# Patient Record
Sex: Female | Born: 1980
Health system: Southern US, Community
[De-identification: ages and names within clinical notes are randomized; demographics above are authoritative.]

## PROBLEM LIST (undated history)

## (undated) DIAGNOSIS — E039 Hypothyroidism, unspecified: Secondary | ICD-10-CM

## (undated) DIAGNOSIS — K219 Gastro-esophageal reflux disease without esophagitis: Secondary | ICD-10-CM

## (undated) DIAGNOSIS — D62 Acute posthemorrhagic anemia: Secondary | ICD-10-CM

## (undated) HISTORY — DX: Gastro-esophageal reflux disease without esophagitis: K21.9

## (undated) HISTORY — DX: Hypothyroidism, unspecified: E03.9

## (undated) HISTORY — PX: TOTAL THYROIDECTOMY: SHX2547

## (undated) HISTORY — PX: PARATHYROIDECTOMY: SHX19

---

## 2014-01-29 ENCOUNTER — Ambulatory Visit: Payer: Self-pay | Admitting: General Practice

## 2014-04-05 ENCOUNTER — Ambulatory Visit: Payer: Self-pay | Admitting: General Practice

## 2015-10-15 DIAGNOSIS — Z9889 Other specified postprocedural states: Secondary | ICD-10-CM | POA: Insufficient documentation

## 2015-10-15 DIAGNOSIS — E890 Postprocedural hypothyroidism: Secondary | ICD-10-CM | POA: Insufficient documentation

## 2016-01-16 IMAGING — CR DG FOOT COMPLETE 3+V*L*
1 series · 3 of 3 positions shown · non-contrast
Comparison: None.

CLINICAL DATA: Medial foot pain.

EXAM:
LEFT FOOT - COMPLETE 3+ VIEW

[Series 1: dxr foot lt comp w/obliques · 0.14mm/px · 3 of 3 slices shown]
[im 1/3]
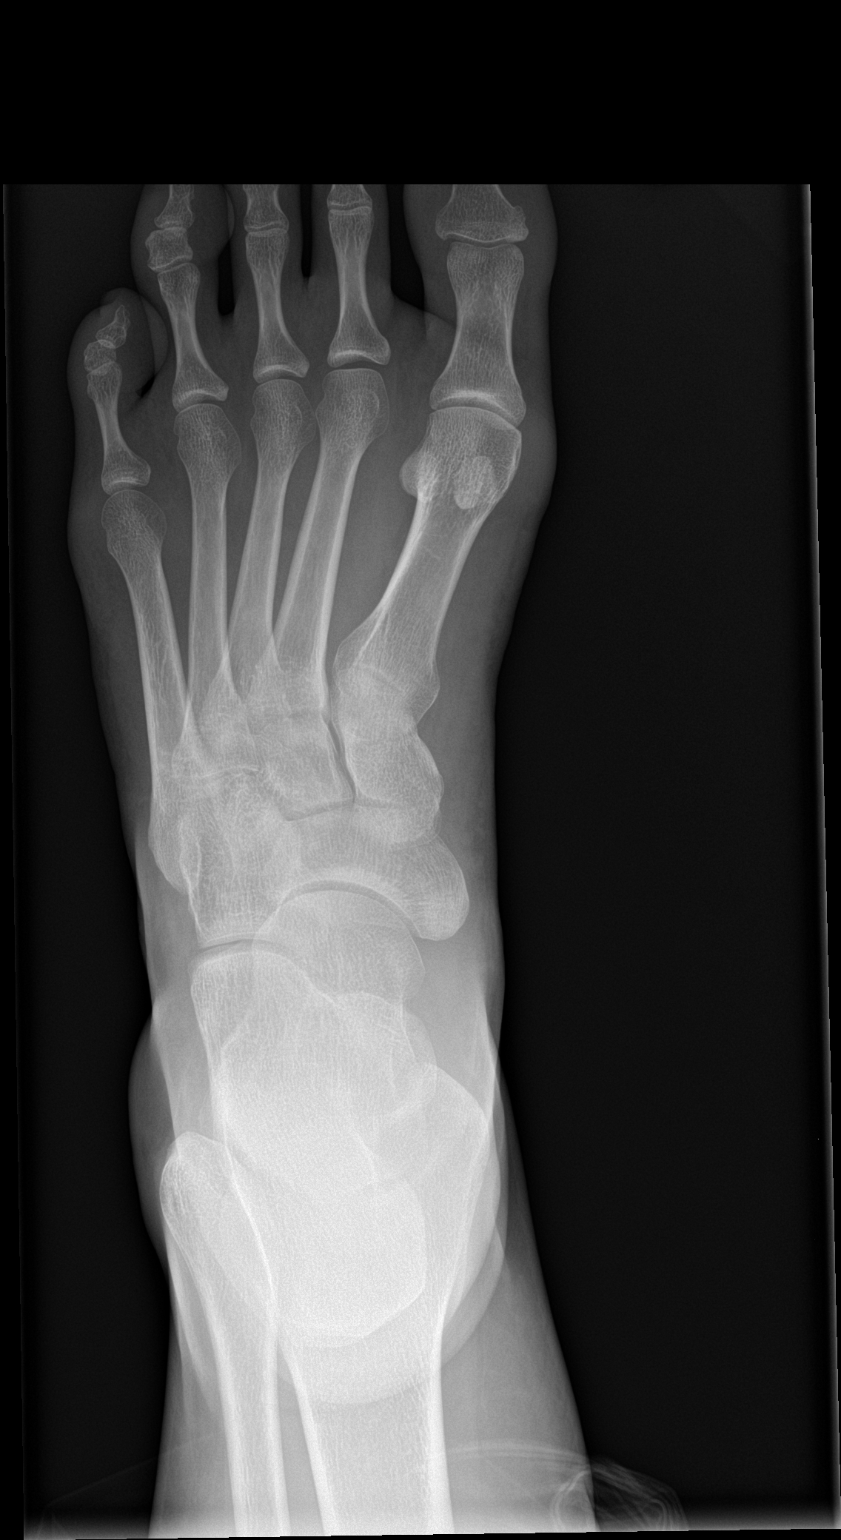
[im 2/3]
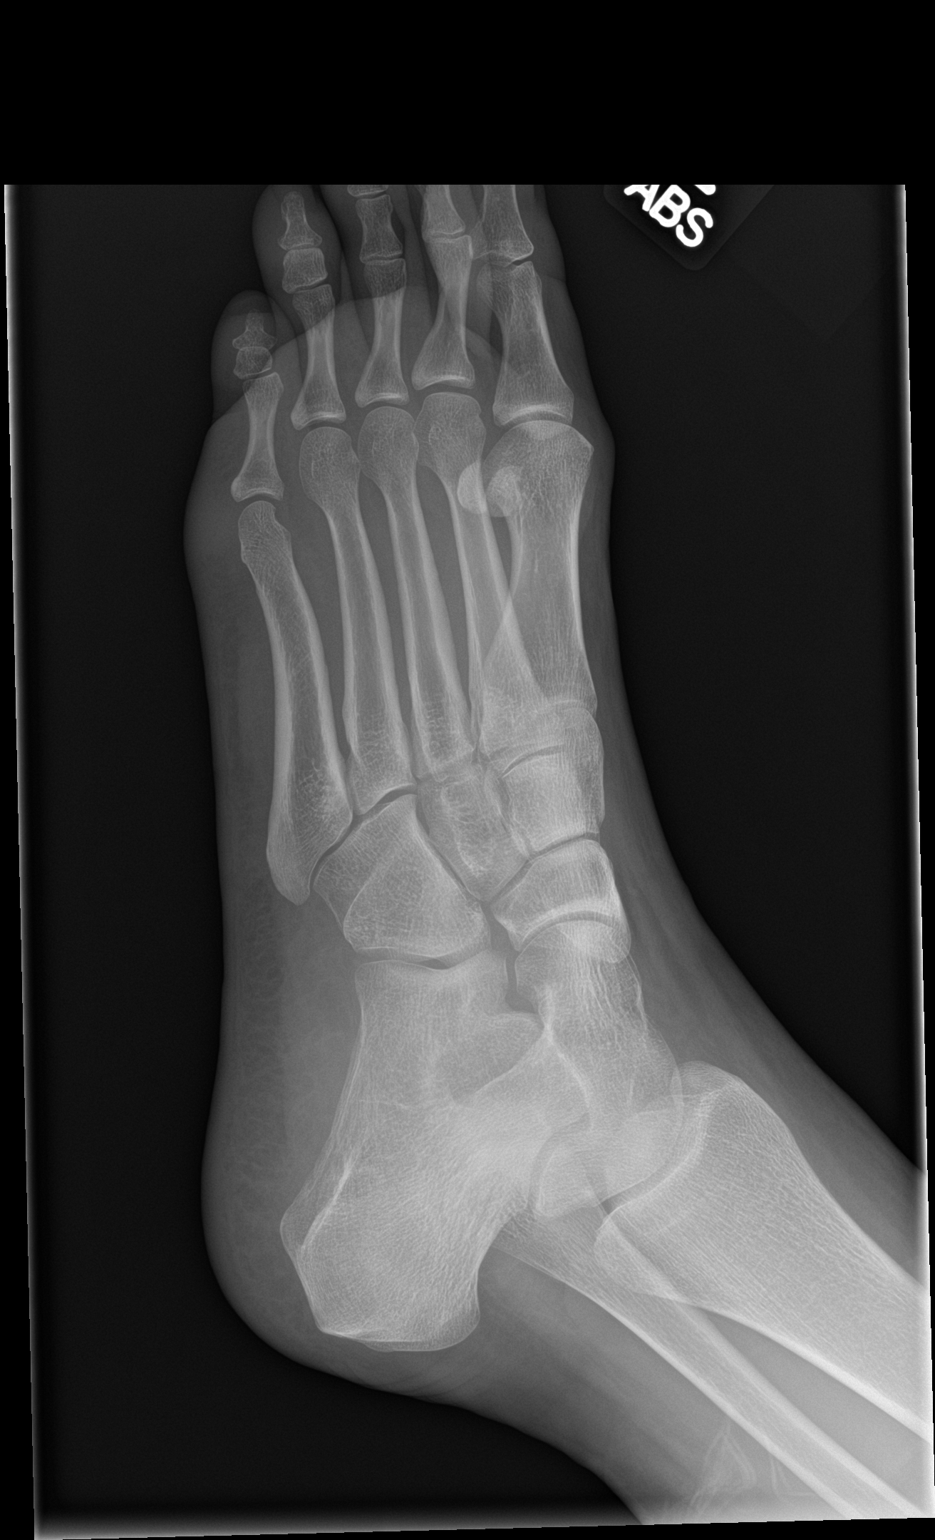
[im 3/3]
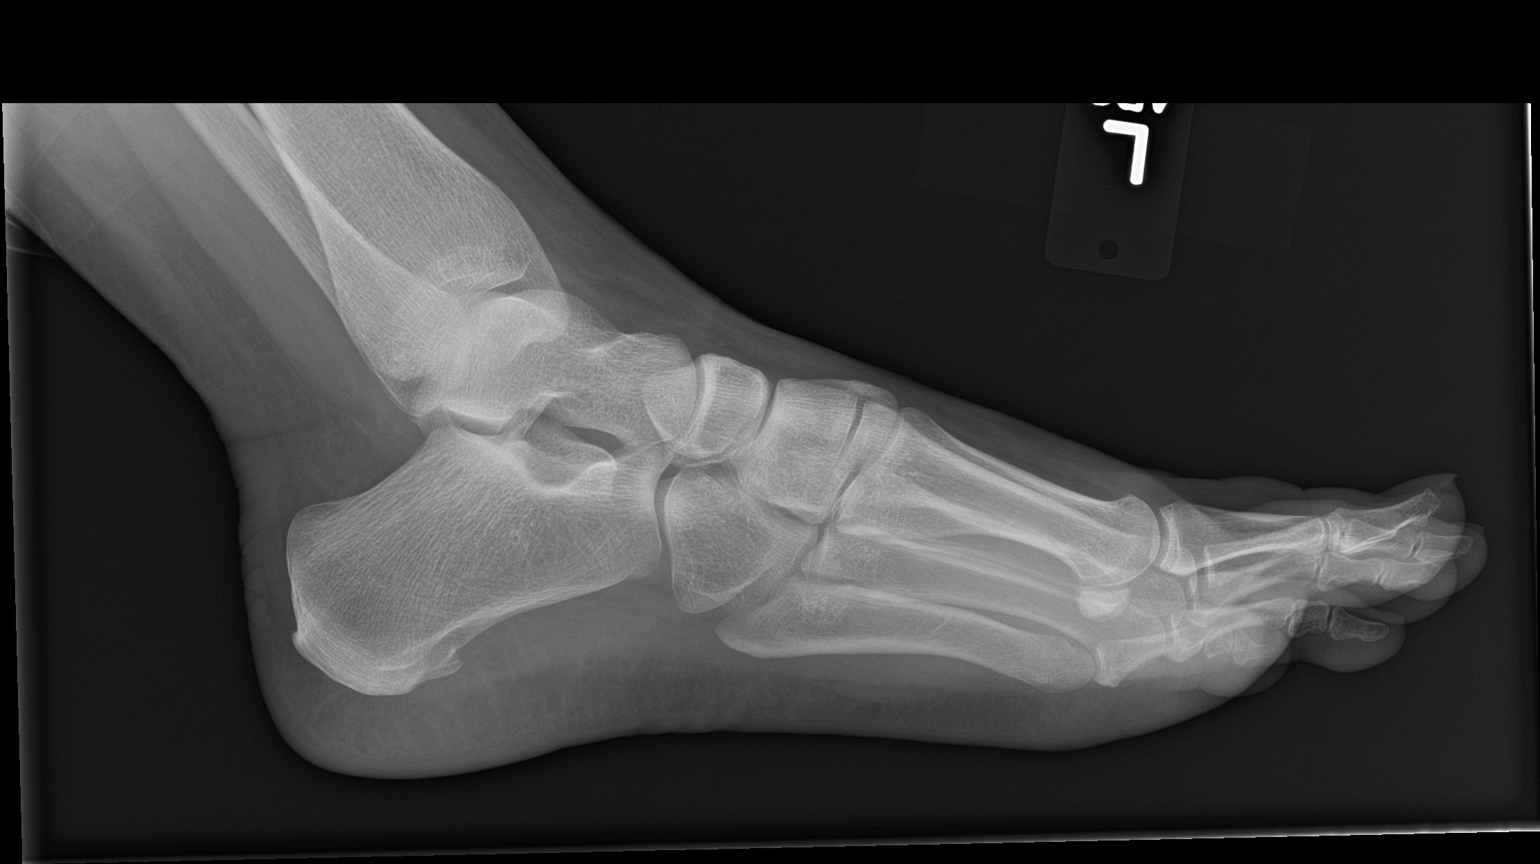

[3 of 3 positions shown; findings below may reference images not displayed]

FINDINGS: No fracture. No bone lesion. Joints are normally spaced and aligned.

Small plantar calcaneal spur.  Soft tissues are unremarkable.
IMPRESSION: Small plantar calcaneal spur.  No other abnormality.

## 2016-01-17 LAB — OB RESULTS CONSOLE GC/CHLAMYDIA
Chlamydia: NEGATIVE
Gonorrhea: NEGATIVE

## 2016-01-31 LAB — OB RESULTS CONSOLE RPR: RPR: NONREACTIVE

## 2016-01-31 LAB — OB RESULTS CONSOLE HIV ANTIBODY (ROUTINE TESTING): HIV: NONREACTIVE

## 2016-01-31 LAB — OB RESULTS CONSOLE ABO/RH: RH TYPE: POSITIVE

## 2016-01-31 LAB — OB RESULTS CONSOLE HEPATITIS B SURFACE ANTIGEN: HEP B S AG: NEGATIVE

## 2016-01-31 LAB — OB RESULTS CONSOLE RUBELLA ANTIBODY, IGM: Rubella: IMMUNE

## 2016-01-31 LAB — OB RESULTS CONSOLE ANTIBODY SCREEN: Antibody Screen: NEGATIVE

## 2016-03-22 IMAGING — US THYROID ULTRASOUND
1 series · 13 of 25 positions shown · non-contrast
Comparison: None.

CLINICAL DATA: Fatigue, swelling below the thyroid. Evaluate
thyroid nodule. Initial encounter.

EXAM:
THYROID ULTRASOUND
TECHNIQUE: Ultrasound examination of the thyroid gland and adjacent soft
tissues was performed.

[Series 1: thyroid ultrasound · 0.08mm/px · 13 of 68 slices shown]
[im 1/68]
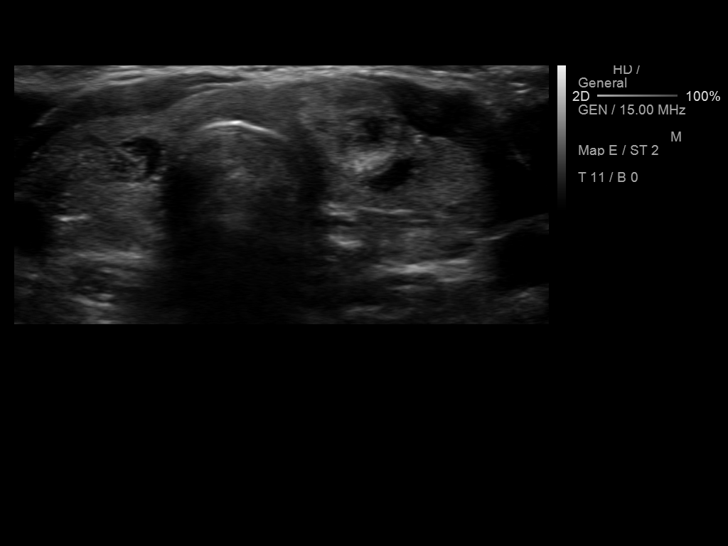
[im 6/68]
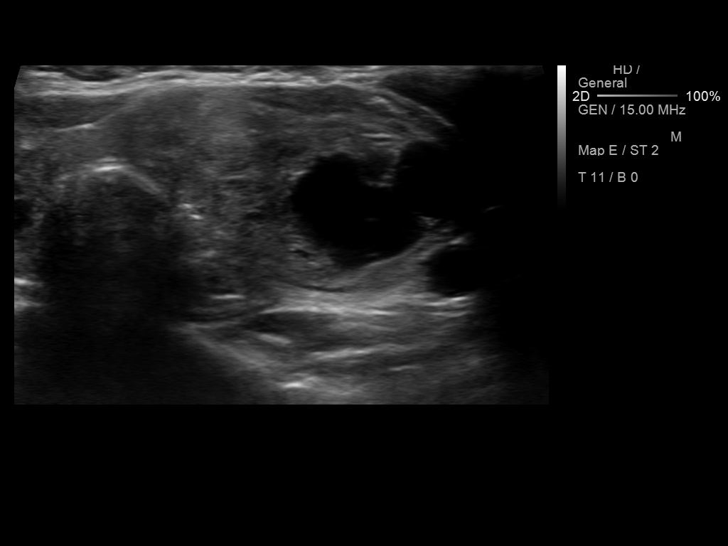
[im 12/68]
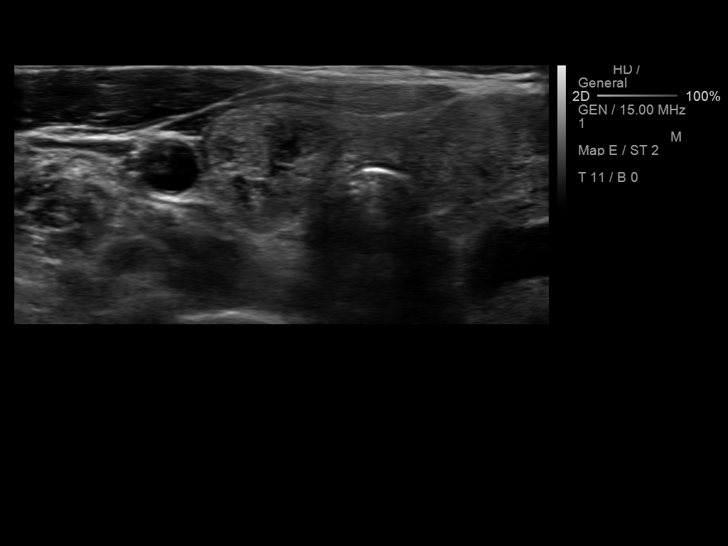
[im 17/68]
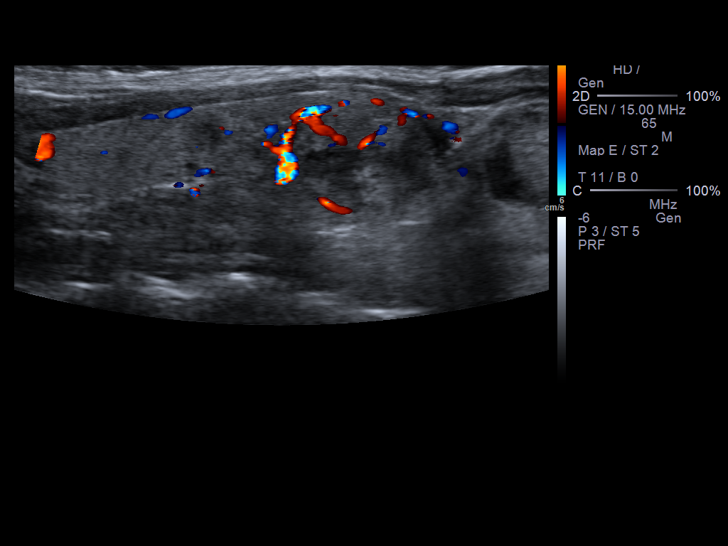
[im 23/68]
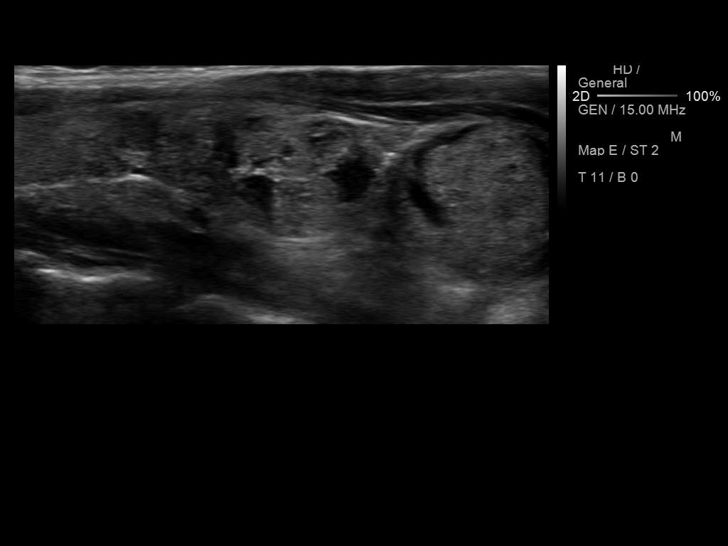
[im 28/68]
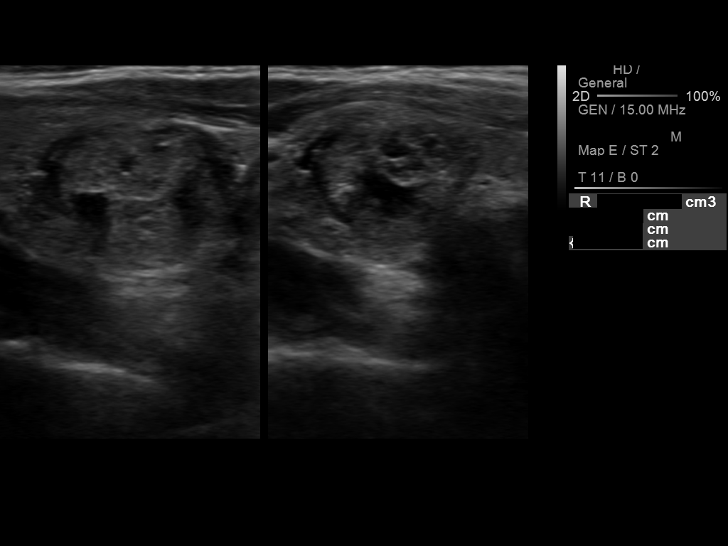
[im 34/68]
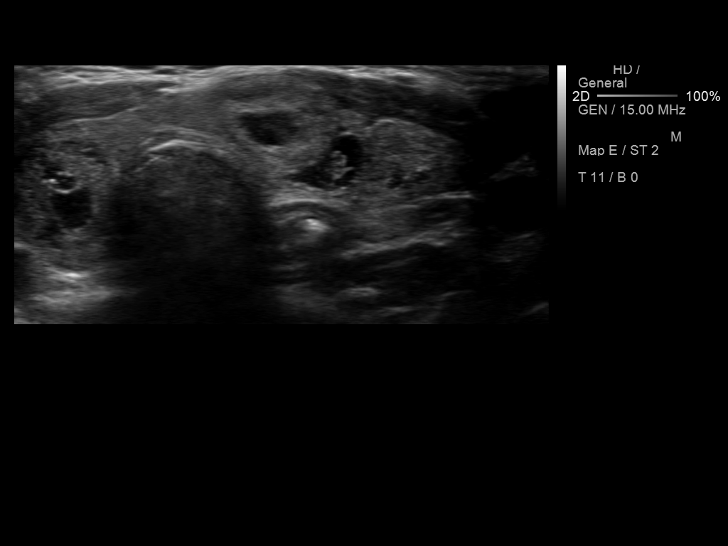
[im 40/68]
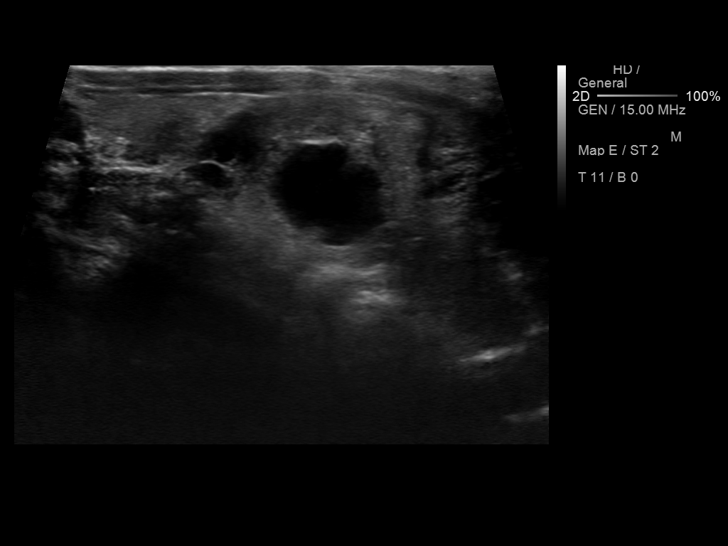
[im 45/68]
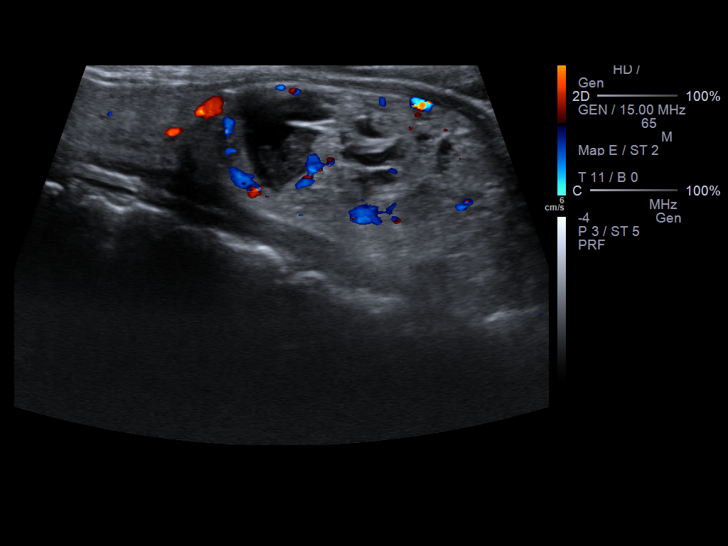
[im 51/68]
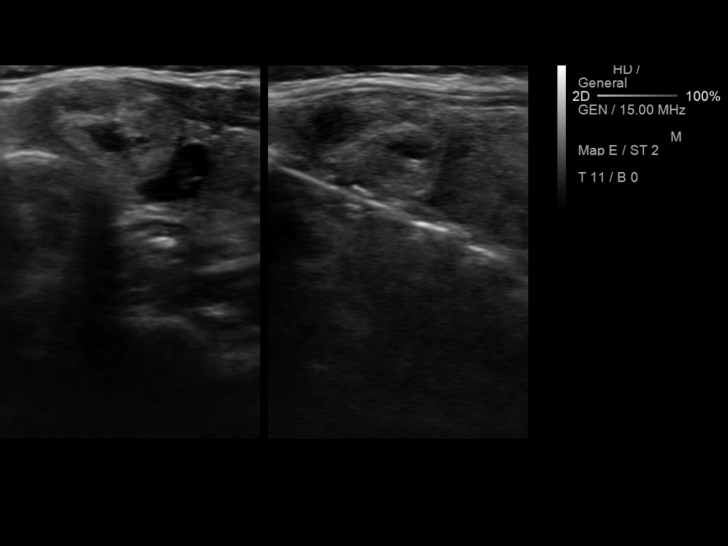
[im 56/68]
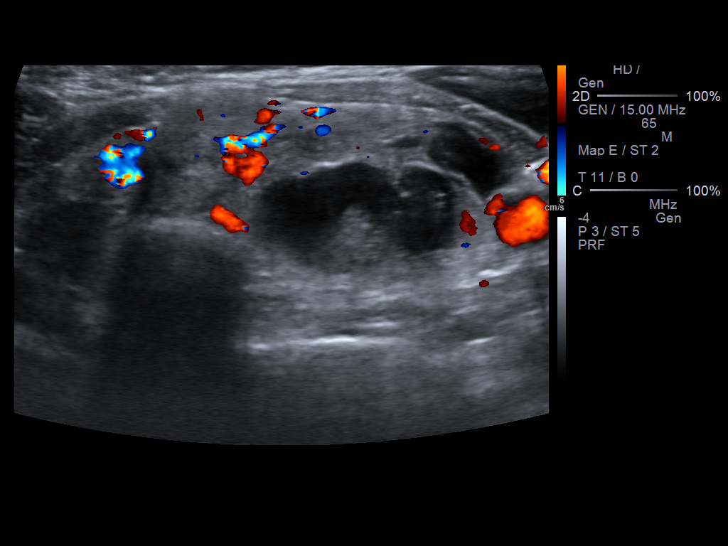
[im 62/68]
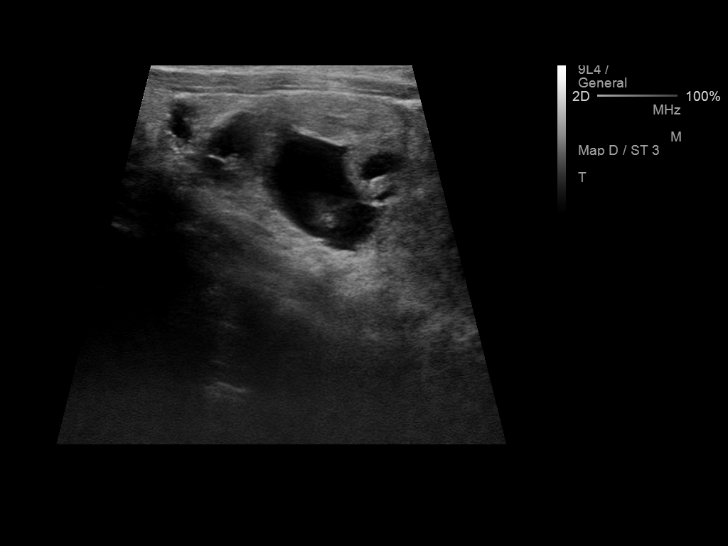
[im 68/68]
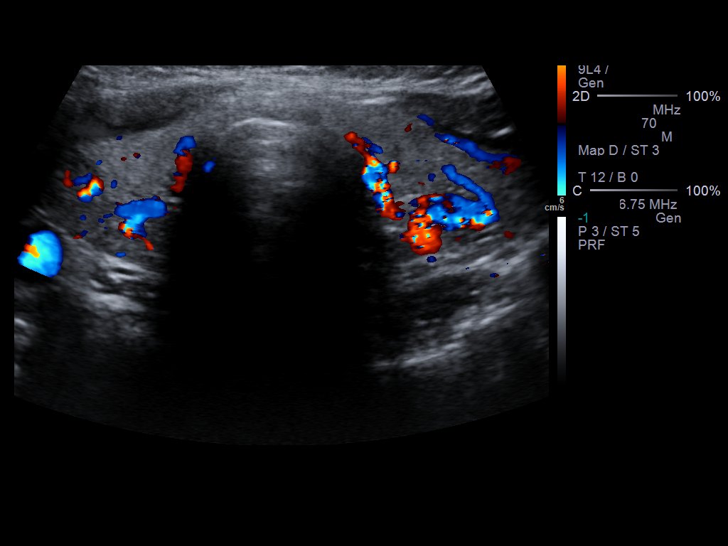

[13 of 25 positions shown; findings below may reference images not displayed]

FINDINGS: Right thyroid lobe

Measurements: Normal in size measuring 4.9 x 1.2 x 1.6 cm.

Right, mid - 0.6 x 0.5 x 0.6 cm - mixed echogenic, solid.

*Right, inferior - 1.8 x 1.3 x 1.5 cm - mixed echogenic, partially
cystic, predominantly solid.

Left thyroid lobe

Measurements: Enlarged measuring 7.3 x 2.3 x 3.4 cm.

Left, mid, medial - 1.4 x 0.9 x 1.4 cm - mixed echogenic, partially
cystic, predominantly solid.

*Left, mid/inferior - 6.0 x 2.3 x 5.7 cm - mixed echogenic,
partially cystic, predominantly solid

Isthmus

Thickness: Normal in size measures 0.4 cm in diameter..

No discrete nodules are identified within the thyroid isthmus.

Lymphadenopathy

None visualized.
IMPRESSION: Findings compatible with multi nodular goiter. The dominant
approximately 1.8 cm complex nodule within the inferior aspect of
the right lobe of the thyroid as well as the dominant approximately
6.0 cm complex mass within the mid / inferior aspect of the left
lobe of the thyroid both meet imaging criteria to recommend
percutaneous sampling as clinically indicated. This recommendation
follows the consensus statement: Management of Thyroid Nodules
Detected at US: Society of Radiologists in Ultrasound Consensus

## 2016-05-21 LAB — OB RESULTS CONSOLE GBS: GBS: NEGATIVE

## 2016-06-17 ENCOUNTER — Encounter (HOSPITAL_COMMUNITY): Payer: Self-pay | Admitting: *Deleted

## 2016-06-17 ENCOUNTER — Telehealth (HOSPITAL_COMMUNITY): Payer: Self-pay | Admitting: *Deleted

## 2016-06-17 NOTE — Telephone Encounter (Signed)
Preadmission screen  

## 2016-06-25 ENCOUNTER — Other Ambulatory Visit: Payer: Self-pay | Admitting: Obstetrics

## 2016-06-25 ENCOUNTER — Inpatient Hospital Stay (HOSPITAL_COMMUNITY): Admission: AD | Admit: 2016-06-25 | Payer: Self-pay | Source: Ambulatory Visit | Admitting: Obstetrics

## 2016-07-01 ENCOUNTER — Inpatient Hospital Stay (HOSPITAL_COMMUNITY)
Admission: RE | Admit: 2016-07-01 | Discharge: 2016-07-04 | DRG: 765 | Disposition: A | Discharge status: Home / Self Care | Payer: BLUE CROSS/BLUE SHIELD | Source: Ambulatory Visit | Attending: Obstetrics and Gynecology | Admitting: Obstetrics and Gynecology

## 2016-07-01 ENCOUNTER — Inpatient Hospital Stay (HOSPITAL_COMMUNITY): Payer: BLUE CROSS/BLUE SHIELD | Admitting: Anesthesiology

## 2016-07-01 ENCOUNTER — Encounter (HOSPITAL_COMMUNITY): Payer: Self-pay

## 2016-07-01 DIAGNOSIS — Z3A41 41 weeks gestation of pregnancy: Secondary | ICD-10-CM

## 2016-07-01 DIAGNOSIS — O4202 Full-term premature rupture of membranes, onset of labor within 24 hours of rupture: Secondary | ICD-10-CM | POA: Diagnosis present

## 2016-07-01 DIAGNOSIS — O411230 Chorioamnionitis, third trimester, not applicable or unspecified: Secondary | ICD-10-CM | POA: Diagnosis present

## 2016-07-01 DIAGNOSIS — O480 Post-term pregnancy: Principal | ICD-10-CM | POA: Diagnosis present

## 2016-07-01 DIAGNOSIS — D509 Iron deficiency anemia, unspecified: Secondary | ICD-10-CM | POA: Diagnosis present

## 2016-07-01 DIAGNOSIS — O9081 Anemia of the puerperium: Secondary | ICD-10-CM | POA: Diagnosis not present

## 2016-07-01 DIAGNOSIS — O99284 Endocrine, nutritional and metabolic diseases complicating childbirth: Secondary | ICD-10-CM | POA: Diagnosis present

## 2016-07-01 DIAGNOSIS — D62 Acute posthemorrhagic anemia: Secondary | ICD-10-CM | POA: Diagnosis not present

## 2016-07-01 DIAGNOSIS — Z3490 Encounter for supervision of normal pregnancy, unspecified, unspecified trimester: Secondary | ICD-10-CM

## 2016-07-01 DIAGNOSIS — Z8249 Family history of ischemic heart disease and other diseases of the circulatory system: Secondary | ICD-10-CM

## 2016-07-01 DIAGNOSIS — E039 Hypothyroidism, unspecified: Secondary | ICD-10-CM | POA: Diagnosis present

## 2016-07-01 HISTORY — DX: Acute posthemorrhagic anemia: D62

## 2016-07-01 LAB — CBC
HCT: 33.5 % — ABNORMAL LOW (ref 36.0–46.0)
Hemoglobin: 11.6 g/dL — ABNORMAL LOW (ref 12.0–15.0)
MCH: 30.3 pg (ref 26.0–34.0)
MCHC: 34.6 g/dL (ref 30.0–36.0)
MCV: 87.5 fL (ref 78.0–100.0)
PLATELETS: 196 10*3/uL (ref 150–400)
RBC: 3.83 MIL/uL — ABNORMAL LOW (ref 3.87–5.11)
RDW: 13.4 % (ref 11.5–15.5)
WBC: 15.8 10*3/uL — AB (ref 4.0–10.5)

## 2016-07-01 LAB — TYPE AND SCREEN
ABO/RH(D): A POS
ANTIBODY SCREEN: NEGATIVE

## 2016-07-01 LAB — ABO/RH: ABO/RH(D): A POS

## 2016-07-01 LAB — RPR: RPR Ser Ql: NONREACTIVE

## 2016-07-01 MED ORDER — LACTATED RINGERS IV SOLN
INTRAVENOUS | Status: DC
  Administered 2016-07-01 – 2016-07-02 (×6): via INTRAVENOUS

## 2016-07-01 MED ORDER — DIPHENHYDRAMINE HCL 50 MG/ML IJ SOLN: 12.5000 mg | INTRAMUSCULAR | Status: DC | PRN

## 2016-07-01 MED ORDER — LACTATED RINGERS IV SOLN
500.0000 mL | INTRAVENOUS | Status: DC | PRN
  Administered 2016-07-01: 1000 mL via INTRAVENOUS

## 2016-07-01 MED ORDER — TERBUTALINE SULFATE 1 MG/ML IJ SOLN: 0.2500 mg | Freq: Once | INTRAMUSCULAR | Status: DC | PRN

## 2016-07-01 MED ORDER — ONDANSETRON HCL 4 MG/2ML IJ SOLN
4.0000 mg | Freq: Four times a day (QID) | INTRAMUSCULAR | Status: DC | PRN
  Administered 2016-07-01: 4 mg via INTRAVENOUS
  Filled 2016-07-01: qty 2

## 2016-07-01 MED ORDER — PHENYLEPHRINE 40 MCG/ML (10ML) SYRINGE FOR IV PUSH (FOR BLOOD PRESSURE SUPPORT)
PREFILLED_SYRINGE | INTRAVENOUS | Status: AC
  Filled 2016-07-01: qty 10

## 2016-07-01 MED ORDER — OXYTOCIN 40 UNITS IN LACTATED RINGERS INFUSION - SIMPLE MED
1.0000 m[IU]/min | INTRAVENOUS | Status: DC
  Administered 2016-07-01: 2 m[IU]/min via INTRAVENOUS
  Filled 2016-07-01: qty 1000

## 2016-07-01 MED ORDER — FLEET ENEMA 7-19 GM/118ML RE ENEM: 1.0000 | ENEMA | Freq: Once | RECTAL | Status: DC

## 2016-07-01 MED ORDER — LEVOTHYROXINE SODIUM 137 MCG PO TABS
212.0000 ug | ORAL_TABLET | Freq: Every day | ORAL | Status: DC
  Administered 2016-07-01: 212 ug via ORAL
  Filled 2016-07-01 (×2): qty 1

## 2016-07-01 MED ORDER — OXYCODONE-ACETAMINOPHEN 5-325 MG PO TABS: 1.0000 | ORAL_TABLET | ORAL | Status: DC | PRN

## 2016-07-01 MED ORDER — PHENYLEPHRINE 40 MCG/ML (10ML) SYRINGE FOR IV PUSH (FOR BLOOD PRESSURE SUPPORT): 80.0000 ug | PREFILLED_SYRINGE | INTRAVENOUS | Status: DC | PRN

## 2016-07-01 MED ORDER — LACTATED RINGERS IV SOLN: 500.0000 mL | Freq: Once | INTRAVENOUS | Status: DC

## 2016-07-01 MED ORDER — LIDOCAINE HCL (PF) 1 % IJ SOLN: 30.0000 mL | INTRAMUSCULAR | Status: DC | PRN

## 2016-07-01 MED ORDER — BUTORPHANOL TARTRATE 1 MG/ML IJ SOLN
1.0000 mg | INTRAMUSCULAR | Status: DC | PRN
  Administered 2016-07-01: 1 mg via INTRAVENOUS

## 2016-07-01 MED ORDER — EPHEDRINE 5 MG/ML INJ: 10.0000 mg | INTRAVENOUS | Status: DC | PRN

## 2016-07-01 MED ORDER — MISOPROSTOL 25 MCG QUARTER TABLET
25.0000 ug | ORAL_TABLET | ORAL | Status: AC | PRN
  Administered 2016-07-01 (×2): 25 ug via VAGINAL
  Filled 2016-07-01 (×2): qty 0.25

## 2016-07-01 MED ORDER — SOD CITRATE-CITRIC ACID 500-334 MG/5ML PO SOLN: 30.0000 mL | ORAL | Status: DC | PRN

## 2016-07-01 MED ORDER — OXYCODONE-ACETAMINOPHEN 5-325 MG PO TABS: 2.0000 | ORAL_TABLET | ORAL | Status: DC | PRN

## 2016-07-01 MED ORDER — LIDOCAINE HCL (PF) 1 % IJ SOLN
INTRAMUSCULAR | Status: DC | PRN
  Administered 2016-07-01: 6 mL via EPIDURAL
  Administered 2016-07-01: 4 mL

## 2016-07-01 MED ORDER — ACETAMINOPHEN 325 MG PO TABS
650.0000 mg | ORAL_TABLET | ORAL | Status: DC | PRN
  Administered 2016-07-02: 650 mg via ORAL
  Administered 2016-07-02: 325 mg via ORAL
  Filled 2016-07-01 (×2): qty 2

## 2016-07-01 MED ORDER — BUTORPHANOL TARTRATE 1 MG/ML IJ SOLN
INTRAMUSCULAR | Status: AC
  Filled 2016-07-01: qty 1

## 2016-07-01 MED ORDER — FENTANYL 2.5 MCG/ML BUPIVACAINE 1/10 % EPIDURAL INFUSION (WH - ANES)
INTRAMUSCULAR | Status: AC
  Filled 2016-07-01: qty 100

## 2016-07-01 MED ORDER — OXYTOCIN 40 UNITS IN LACTATED RINGERS INFUSION - SIMPLE MED: 2.5000 [IU]/h | INTRAVENOUS | Status: DC

## 2016-07-01 MED ORDER — FENTANYL 2.5 MCG/ML BUPIVACAINE 1/10 % EPIDURAL INFUSION (WH - ANES)
14.0000 mL/h | INTRAMUSCULAR | Status: DC | PRN
  Administered 2016-07-01 (×2): 14 mL/h via EPIDURAL
  Filled 2016-07-01: qty 100

## 2016-07-01 MED ORDER — OXYTOCIN BOLUS FROM INFUSION: 500.0000 mL | Freq: Once | INTRAVENOUS | Status: DC

## 2016-07-01 NOTE — H&P (Signed)
Mary Griffin is a 36 y.o. G1P0 at 5147w4d presenting for post-dates IOL. On admission  Pt notes rare contractions . Good fetal movement, No vaginal bleeding, not leaking fluid.  Pt has received cytotec x 2 then started pitocin around 1030am. SROM about 2p. Contractions increasingly painful.   [facility] at [facility] 18 wks - [facility], tx here at 18 wks - Dated by LMP c/w 10 wk u/s - h/o hypothyroidism after total thyroidectomy, follows with endocrine, TSH has been stable - Growth 2/8: 8'12, 88%, AC 93% - >35# wt gain in preg   Prenatal Transfer Tool  Maternal Diabetes: No Genetic Screening: Normal Maternal Ultrasounds/Referrals: Normal Fetal Ultrasounds or other Referrals:  None Maternal Substance Abuse:  No Significant Maternal Medications:  None Significant Maternal Lab Results: None             OB History    Gravida Para Term Preterm AB Living   1             SAB TAB Ectopic Multiple Live Births                     Past Medical History:  Diagnosis Date  . GERD (gastroesophageal reflux disease)   . Hypothyroidism         Past Surgical History:  Procedure Laterality Date  . PARATHYROIDECTOMY    . TOTAL THYROIDECTOMY     Family History: family history includes Hypertension in her maternal grandmother; Thyroid disease in her maternal grandmother. Social History:  has no tobacco, alcohol, and drug history on file.  Review of Systems - Negative except discomfort of pregnancy    Physical Exam:   Vitals:   07/01/16 1201 07/01/16 1254 07/01/16 1421 07/01/16 1432  BP: 127/70 132/72 125/67 124/63  Pulse: 74 73 77 82  Resp: 16 16 18 18   Temp:  98.9 F (37.2 C)    TempSrc:  Axillary    Weight:      Height:        Gen: well appearing, no distress  Back: no CVAT Abd: gravid, NT, no RUQ pain LE: trace edema, equal bilaterally, non-tender Toco: q 4 min, pitocin FH: baseline 125s, accelerations present, no  deceleratons, 10 beat variability  Prenatal labs: ABO, Rh:  A+ Antibody:  neg Rubella: !Error! immune RPR:   NR HBsAg:  neg  HIV:   neg GBS: Negative (01/04 0000)  1 hr Glucola 124          Genetic screening nl NT, nl AFP Anatomy US normal   Assessment/Plan: 36 y.o. G1P0 at 4647w4d IOL, post-dates, plan cytotec o/n then pitocin in am GBS neg        OK for epidural when reafy Reactive fetal testing.    Mary Griffin A. 07/01/2016 3:08 PM

## 2016-07-01 NOTE — Anesthesia Procedure Notes (Signed)

## 2016-07-01 NOTE — Anesthesia Pain Management Evaluation Note (Signed)
  CRNA Pain Management Visit Note  Patient: Mary Griffin, 36 y.o., female  "Hello I am a member of the anesthesia team at [facility]. We have an anesthesia team available at all times to provide care throughout the hospital, including epidural management and anesthesia for C-section. I don't know your plan for the delivery whether it a natural birth, water birth, IV sedation, nitrous supplementation, doula or epidural, but we want to meet your pain goals."   1.Was your pain managed to your expectations on prior hospitalizations?   No prior hospitalizations  2.What is your expectation for pain management during this hospitalization?     Epidural  3.How can we help you reach that goal?   Record the patient's initial score and the patient's pain goal.   Pain: 2  Pain Goal: 6 [facility] wants you to be able to say your pain was always managed very well.  Mary Griffin,Mary Griffin 07/01/2016

## 2016-07-01 NOTE — Progress Notes (Signed)
S: Doing well, no complaints, pain well controlled with epidural, pt has been able to sleep since epidural  O: BP 115/65   Pulse 82   Temp 98.9 F (37.2 C) (Axillary)   Resp 18   Ht 5\' 8"  (1.727 m)   Wt 95.3 kg (210 lb)   SpO2 98%   BMI 31.93 kg/m    FHT:  FHR: 120s bpm, variability: moderate,  accelerations:  Present,  decelerations:  Absent UC:   irregular, every 3-6 minutes SVE:   Dilation: 3 Effacement (%): 80 Station: -2 Exam by:: S Nix RN   A / P:  36 y.o.  Obstetric History   G1   P0   T0   P0   A0   L0    SAB0   TAB0   Ectopic0   Multiple0   Live Births0    at [redacted]w[redacted]d post-dates IOL, slow progress but now finally in active labor. continue to watch progress. suspect LGA  Fetal Wellbeing:  Category I Pain Control:  Epidural  Anticipated MOD:  NSVD  Keili Hasten A. 07/01/2016, 6:01 PM

## 2016-07-01 NOTE — Progress Notes (Signed)
S: Doing well, no complaints, pain well controlled with epidural  O: BP 126/80   Pulse 70   Temp 98 F (36.7 C) (Oral)   Resp 16   Ht 5\' 8"  (1.727 m)   Wt 95.3 kg (210 lb)   SpO2 98%   BMI 31.93 kg/m    FHT:  FHR: 130s bpm, variability: moderate,  accelerations:  Present,  decelerations:  Absent UC:   irregular, every 3-6 minutes IUPC placed now SVE:   Dilation: 3 Effacement (%): 80 Station: -2 Exam by:: Dr Ernestina PennaFogleman   sligh change to 4/90/-2   A / P:  36 y.o.  Obstetric History   G1   P0   T0   P0   A0   L0    SAB0   TAB0   Ectopic0   Multiple0   Live Births0    at 2474w5d post-dates IOL, slow progress, IUPC placed  Fetal Wellbeing:  Category I Pain Control:  Epidural  Anticipated MOD:  NSVD  Mary Griffin A. 07/01/2016, 7:40 PM

## 2016-07-01 NOTE — Anesthesia Preprocedure Evaluation (Signed)

## 2016-07-02 ENCOUNTER — Encounter (HOSPITAL_COMMUNITY)
Admission: RE | Disposition: A | Discharge status: Home / Self Care | Payer: Self-pay | Source: Ambulatory Visit | Attending: Obstetrics and Gynecology

## 2016-07-02 ENCOUNTER — Encounter (HOSPITAL_COMMUNITY): Payer: Self-pay

## 2016-07-02 LAB — CBC WITH DIFFERENTIAL/PLATELET
BASOS ABS: 0 10*3/uL (ref 0.0–0.1)
BASOS PCT: 0 %
Eosinophils Absolute: 0 10*3/uL (ref 0.0–0.7)
Eosinophils Relative: 0 %
HEMATOCRIT: 26.7 % — AB (ref 36.0–46.0)
HEMOGLOBIN: 9.4 g/dL — AB (ref 12.0–15.0)
LYMPHS PCT: 6 %
Lymphs Abs: 1.5 10*3/uL (ref 0.7–4.0)
MCH: 31.5 pg (ref 26.0–34.0)
MCHC: 35.2 g/dL (ref 30.0–36.0)
MCV: 89.6 fL (ref 78.0–100.0)
MONO ABS: 1.3 10*3/uL — AB (ref 0.1–1.0)
Monocytes Relative: 5 %
NEUTROS ABS: 23.6 10*3/uL — AB (ref 1.7–7.7)
NEUTROS PCT: 90 %
Platelets: 149 10*3/uL — ABNORMAL LOW (ref 150–400)
RBC: 2.98 MIL/uL — AB (ref 3.87–5.11)
RDW: 13.8 % (ref 11.5–15.5)
WBC: 26.3 10*3/uL — ABNORMAL HIGH (ref 4.0–10.5)

## 2016-07-02 LAB — COMPREHENSIVE METABOLIC PANEL
ALBUMIN: 2.2 g/dL — AB (ref 3.5–5.0)
ALK PHOS: 108 U/L (ref 38–126)
ALT: 12 U/L — AB (ref 14–54)
AST: 21 U/L (ref 15–41)
Anion gap: 6 (ref 5–15)
BUN: 8 mg/dL (ref 6–20)
CO2: 23 mmol/L (ref 22–32)
CREATININE: 0.65 mg/dL (ref 0.44–1.00)
Calcium: 7.1 mg/dL — ABNORMAL LOW (ref 8.9–10.3)
Chloride: 106 mmol/L (ref 101–111)
GFR calc Af Amer: >60 mL/min (ref >60)
GLUCOSE: 145 mg/dL — AB (ref 65–99)
POTASSIUM: 3.5 mmol/L (ref 3.5–5.1)
Sodium: 135 mmol/L (ref 135–145)
TOTAL PROTEIN: 5.1 g/dL — AB (ref 6.5–8.1)
Total Bilirubin: 0.5 mg/dL (ref 0.3–1.2)

## 2016-07-02 SURGERY — Surgical Case
Anesthesia: Epidural

## 2016-07-02 MED ORDER — LEVOTHYROXINE SODIUM 25 MCG PO TABS
212.5000 ug | ORAL_TABLET | Freq: Every day | ORAL | Status: DC
  Administered 2016-07-03: 212.5 ug via ORAL
  Filled 2016-07-02 (×3): qty 8.5

## 2016-07-02 MED ORDER — SODIUM CHLORIDE 0.9 % IR SOLN
Status: DC | PRN
  Administered 2016-07-02: 1000 mL

## 2016-07-02 MED ORDER — MORPHINE SULFATE (PF) 0.5 MG/ML IJ SOLN
INTRAMUSCULAR | Status: DC | PRN
  Administered 2016-07-02: 4 mg via EPIDURAL

## 2016-07-02 MED ORDER — EPHEDRINE 5 MG/ML INJ
INTRAVENOUS | Status: AC
  Filled 2016-07-02: qty 10

## 2016-07-02 MED ORDER — PRENATAL MULTIVITAMIN CH
1.0000 | ORAL_TABLET | Freq: Every day | ORAL | Status: DC
  Administered 2016-07-03 – 2016-07-04 (×2): 1 via ORAL
  Filled 2016-07-02 (×2): qty 1

## 2016-07-02 MED ORDER — ONDANSETRON HCL 4 MG/2ML IJ SOLN
INTRAMUSCULAR | Status: AC
  Filled 2016-07-02: qty 2

## 2016-07-02 MED ORDER — DIBUCAINE 1 % RE OINT: 1.0000 "application " | TOPICAL_OINTMENT | RECTAL | Status: DC | PRN

## 2016-07-02 MED ORDER — KETOROLAC TROMETHAMINE 30 MG/ML IJ SOLN: 30.0000 mg | Freq: Four times a day (QID) | INTRAMUSCULAR | Status: DC | PRN

## 2016-07-02 MED ORDER — ACETAMINOPHEN 500 MG PO TABS: 1000.0000 mg | ORAL_TABLET | Freq: Four times a day (QID) | ORAL | Status: DC | PRN

## 2016-07-02 MED ORDER — AMPICILLIN-SULBACTAM SODIUM 3 (2-1) G IJ SOLR
3.0000 g | Freq: Four times a day (QID) | INTRAMUSCULAR | Status: DC
  Administered 2016-07-02: 3 g via INTRAVENOUS
  Filled 2016-07-02 (×2): qty 3

## 2016-07-02 MED ORDER — DIPHENHYDRAMINE HCL 50 MG/ML IJ SOLN: 12.5000 mg | INTRAMUSCULAR | Status: DC | PRN

## 2016-07-02 MED ORDER — MORPHINE SULFATE (PF) 0.5 MG/ML IJ SOLN
INTRAMUSCULAR | Status: AC
  Filled 2016-07-02: qty 10

## 2016-07-02 MED ORDER — LIDOCAINE-EPINEPHRINE (PF) 2 %-1:200000 IJ SOLN
INTRAMUSCULAR | Status: DC | PRN
  Administered 2016-07-02: 10 mL via EPIDURAL

## 2016-07-02 MED ORDER — SIMETHICONE 80 MG PO CHEW
80.0000 mg | CHEWABLE_TABLET | ORAL | Status: DC | PRN
  Administered 2016-07-03: 80 mg via ORAL

## 2016-07-02 MED ORDER — SIMETHICONE 80 MG PO CHEW
80.0000 mg | CHEWABLE_TABLET | ORAL | Status: DC
  Administered 2016-07-03 – 2016-07-04 (×2): 80 mg via ORAL
  Filled 2016-07-02 (×2): qty 1

## 2016-07-02 MED ORDER — LEVOTHYROXINE SODIUM 200 MCG PO TABS: 200.0000 ug | ORAL_TABLET | Freq: Every day | ORAL | Status: DC

## 2016-07-02 MED ORDER — NALBUPHINE HCL 10 MG/ML IJ SOLN: 5.0000 mg | Freq: Once | INTRAMUSCULAR | Status: DC | PRN

## 2016-07-02 MED ORDER — LACTATED RINGERS IV SOLN
INTRAVENOUS | Status: DC
  Administered 2016-07-02: 15:00:00 via INTRAVENOUS

## 2016-07-02 MED ORDER — MENTHOL 3 MG MT LOZG: 1.0000 | LOZENGE | OROMUCOSAL | Status: DC | PRN

## 2016-07-02 MED ORDER — LACTATED RINGERS IV BOLUS (SEPSIS)
1000.0000 mL | Freq: Once | INTRAVENOUS | Status: AC
  Administered 2016-07-02: 1000 mL via INTRAVENOUS

## 2016-07-02 MED ORDER — DIPHENHYDRAMINE HCL 25 MG PO CAPS: 25.0000 mg | ORAL_CAPSULE | Freq: Four times a day (QID) | ORAL | Status: DC | PRN

## 2016-07-02 MED ORDER — ERYTHROMYCIN 5 MG/GM OP OINT
TOPICAL_OINTMENT | OPHTHALMIC | Status: AC
  Filled 2016-07-02: qty 1

## 2016-07-02 MED ORDER — WITCH HAZEL-GLYCERIN EX PADS: 1.0000 "application " | MEDICATED_PAD | CUTANEOUS | Status: DC | PRN

## 2016-07-02 MED ORDER — MEPERIDINE HCL 25 MG/ML IJ SOLN: 6.2500 mg | INTRAMUSCULAR | Status: DC | PRN

## 2016-07-02 MED ORDER — DEXAMETHASONE SODIUM PHOSPHATE 4 MG/ML IJ SOLN
INTRAMUSCULAR | Status: AC
  Filled 2016-07-02: qty 1

## 2016-07-02 MED ORDER — SODIUM CHLORIDE 0.9% FLUSH: 3.0000 mL | INTRAVENOUS | Status: DC | PRN

## 2016-07-02 MED ORDER — ONDANSETRON HCL 4 MG/2ML IJ SOLN
INTRAMUSCULAR | Status: DC | PRN
  Administered 2016-07-02: 4 mg via INTRAVENOUS

## 2016-07-02 MED ORDER — SIMETHICONE 80 MG PO CHEW
80.0000 mg | CHEWABLE_TABLET | Freq: Three times a day (TID) | ORAL | Status: DC
  Administered 2016-07-02 – 2016-07-04 (×5): 80 mg via ORAL
  Filled 2016-07-02 (×5): qty 1

## 2016-07-02 MED ORDER — OXYTOCIN 10 UNIT/ML IJ SOLN
INTRAMUSCULAR | Status: AC
  Filled 2016-07-02: qty 4

## 2016-07-02 MED ORDER — KETOROLAC TROMETHAMINE 30 MG/ML IJ SOLN
30.0000 mg | Freq: Four times a day (QID) | INTRAMUSCULAR | Status: DC | PRN
  Administered 2016-07-02: 30 mg via INTRAMUSCULAR

## 2016-07-02 MED ORDER — SODIUM CHLORIDE 0.9 % IV SOLN
8.0000 mg | Freq: Two times a day (BID) | INTRAVENOUS | Status: DC
  Administered 2016-07-02: 8 mg via INTRAVENOUS
  Filled 2016-07-02 (×2): qty 4

## 2016-07-02 MED ORDER — ZOLPIDEM TARTRATE 5 MG PO TABS: 5.0000 mg | ORAL_TABLET | Freq: Every evening | ORAL | Status: DC | PRN

## 2016-07-02 MED ORDER — OXYTOCIN 40 UNITS IN LACTATED RINGERS INFUSION - SIMPLE MED: 2.5000 [IU]/h | INTRAVENOUS | Status: AC

## 2016-07-02 MED ORDER — IBUPROFEN 600 MG PO TABS
600.0000 mg | ORAL_TABLET | Freq: Four times a day (QID) | ORAL | Status: DC
  Administered 2016-07-02 – 2016-07-04 (×8): 600 mg via ORAL
  Filled 2016-07-02 (×8): qty 1

## 2016-07-02 MED ORDER — LACTATED RINGERS IV SOLN
INTRAVENOUS | Status: DC | PRN
  Administered 2016-07-02: 07:00:00 via INTRAVENOUS

## 2016-07-02 MED ORDER — ONDANSETRON HCL 4 MG/2ML IJ SOLN: 4.0000 mg | Freq: Three times a day (TID) | INTRAMUSCULAR | Status: DC | PRN

## 2016-07-02 MED ORDER — ONDANSETRON HCL 4 MG/2ML IJ SOLN: 4.0000 mg | Freq: Four times a day (QID) | INTRAMUSCULAR | Status: DC

## 2016-07-02 MED ORDER — SENNOSIDES-DOCUSATE SODIUM 8.6-50 MG PO TABS
2.0000 | ORAL_TABLET | ORAL | Status: DC
  Administered 2016-07-03 – 2016-07-04 (×2): 2 via ORAL
  Filled 2016-07-02 (×2): qty 2

## 2016-07-02 MED ORDER — FAMOTIDINE IN NACL 20-0.9 MG/50ML-% IV SOLN
20.0000 mg | Freq: Once | INTRAVENOUS | Status: AC
Start: 2016-07-02 — End: 2016-07-02
  Administered 2016-07-02: 20 mg via INTRAVENOUS
  Filled 2016-07-02: qty 50

## 2016-07-02 MED ORDER — OXYTOCIN 10 UNIT/ML IJ SOLN
INTRAVENOUS | Status: DC | PRN
  Administered 2016-07-02: 40 [IU] via INTRAVENOUS

## 2016-07-02 MED ORDER — PROMETHAZINE HCL 25 MG/ML IJ SOLN
12.5000 mg | Freq: Once | INTRAMUSCULAR | Status: AC
Start: 2016-07-02 — End: 2016-07-02
  Administered 2016-07-02: 12.5 mg via INTRAVENOUS
  Filled 2016-07-02: qty 1

## 2016-07-02 MED ORDER — ACETAMINOPHEN 325 MG PO TABS
650.0000 mg | ORAL_TABLET | ORAL | Status: DC | PRN
  Administered 2016-07-03 – 2016-07-04 (×2): 650 mg via ORAL
  Filled 2016-07-02 (×2): qty 2

## 2016-07-02 MED ORDER — DEXTROSE 5 % IV SOLN
1.0000 ug/kg/h | INTRAVENOUS | Status: DC | PRN
  Filled 2016-07-02: qty 2

## 2016-07-02 MED ORDER — ONDANSETRON HCL 4 MG/2ML IJ SOLN: 4.0000 mg | Freq: Four times a day (QID) | INTRAMUSCULAR | Status: DC | PRN

## 2016-07-02 MED ORDER — COCONUT OIL OIL
1.0000 "application " | TOPICAL_OIL | Status: DC | PRN
  Filled 2016-07-02: qty 120

## 2016-07-02 MED ORDER — NALBUPHINE HCL 10 MG/ML IJ SOLN: 5.0000 mg | INTRAMUSCULAR | Status: DC | PRN

## 2016-07-02 MED ORDER — KETOROLAC TROMETHAMINE 30 MG/ML IJ SOLN
INTRAMUSCULAR | Status: AC
  Filled 2016-07-02: qty 1

## 2016-07-02 MED ORDER — TETANUS-DIPHTH-ACELL PERTUSSIS 5-2.5-18.5 LF-MCG/0.5 IM SUSP: 0.5000 mL | Freq: Once | INTRAMUSCULAR | Status: DC

## 2016-07-02 MED ORDER — DIPHENHYDRAMINE HCL 25 MG PO CAPS
25.0000 mg | ORAL_CAPSULE | ORAL | Status: DC | PRN
  Filled 2016-07-02: qty 1

## 2016-07-02 MED ORDER — HYDROMORPHONE HCL 1 MG/ML IJ SOLN: 0.2500 mg | INTRAMUSCULAR | Status: DC | PRN

## 2016-07-02 MED ORDER — NALOXONE HCL 0.4 MG/ML IJ SOLN: 0.4000 mg | INTRAMUSCULAR | Status: DC | PRN

## 2016-07-02 MED ORDER — SCOPOLAMINE 1 MG/3DAYS TD PT72: 1.0000 | MEDICATED_PATCH | Freq: Once | TRANSDERMAL | Status: DC

## 2016-07-02 MED ORDER — OXYCODONE HCL 5 MG PO TABS: 5.0000 mg | ORAL_TABLET | Freq: Once | ORAL | Status: DC | PRN

## 2016-07-02 MED ORDER — SCOPOLAMINE 1 MG/3DAYS TD PT72
MEDICATED_PATCH | TRANSDERMAL | Status: DC | PRN
  Administered 2016-07-02: 1 via TRANSDERMAL

## 2016-07-02 MED ORDER — SCOPOLAMINE 1 MG/3DAYS TD PT72
MEDICATED_PATCH | TRANSDERMAL | Status: AC
  Filled 2016-07-02: qty 1

## 2016-07-02 MED ORDER — OXYCODONE HCL 5 MG/5ML PO SOLN: 5.0000 mg | Freq: Once | ORAL | Status: DC | PRN

## 2016-07-02 SURGICAL SUPPLY — 36 items
BENZOIN TINCTURE PRP APPL 2/3 (GAUZE/BANDAGES/DRESSINGS) ×2 IMPLANT
CHLORAPREP W/TINT 26ML (MISCELLANEOUS) ×2 IMPLANT
CLAMP CORD UMBIL (MISCELLANEOUS) ×2 IMPLANT
CLOSURE STERI STRIP 1/2 X4 (GAUZE/BANDAGES/DRESSINGS) ×2 IMPLANT
CLOTH BEACON ORANGE TIMEOUT ST (SAFETY) ×2 IMPLANT
CONTAINER PREFILL 10% NBF 15ML (MISCELLANEOUS) IMPLANT
DRSG OPSITE POSTOP 4X10 (GAUZE/BANDAGES/DRESSINGS) ×2 IMPLANT
ELECT REM PT RETURN 9FT ADLT (ELECTROSURGICAL) ×2
ELECTRODE REM PT RTRN 9FT ADLT (ELECTROSURGICAL) ×1 IMPLANT
EXTRACTOR VACUUM M CUP 4 TUBE (SUCTIONS) IMPLANT
GLOVE BIO SURGEON STRL SZ 6.5 (GLOVE) ×4 IMPLANT
GLOVE BIOGEL PI IND STRL 7.0 (GLOVE) ×4 IMPLANT
GLOVE BIOGEL PI INDICATOR 7.0 (GLOVE) ×4
GOWN STRL REUS W/TWL LRG LVL3 (GOWN DISPOSABLE) ×4 IMPLANT
KIT ABG SYR 3ML LUER SLIP (SYRINGE) IMPLANT
NEEDLE HYPO 22GX1.5 SAFETY (NEEDLE) IMPLANT
NEEDLE HYPO 25X5/8 SAFETYGLIDE (NEEDLE) IMPLANT
NS IRRIG 1000ML POUR BTL (IV SOLUTION) ×2 IMPLANT
PACK C SECTION WH (CUSTOM PROCEDURE TRAY) ×2 IMPLANT
PAD OB MATERNITY 4.3X12.25 (PERSONAL CARE ITEMS) ×2 IMPLANT
PENCIL SMOKE EVAC W/HOLSTER (ELECTROSURGICAL) ×2 IMPLANT
STRIP CLOSURE SKIN 1/2X4 (GAUZE/BANDAGES/DRESSINGS) IMPLANT
SUT MON AB 4-0 PS1 27 (SUTURE) ×2 IMPLANT
SUT PLAIN 0 NONE (SUTURE) IMPLANT
SUT PLAIN 2 0 (SUTURE) ×1
SUT PLAIN 2 0 XLH (SUTURE) IMPLANT
SUT PLAIN ABS 2-0 CT1 27XMFL (SUTURE) ×1 IMPLANT
SUT VIC AB 0 CT1 36 (SUTURE) ×4 IMPLANT
SUT VIC AB 0 CTX 36 (SUTURE) ×3
SUT VIC AB 0 CTX36XBRD ANBCTRL (SUTURE) ×3 IMPLANT
SUT VIC AB 2-0 CT1 27 (SUTURE) ×1
SUT VIC AB 2-0 CT1 TAPERPNT 27 (SUTURE) ×1 IMPLANT
SUT VIC AB 2-0 CTX 36 (SUTURE) ×2 IMPLANT
SYR CONTROL 10ML LL (SYRINGE) IMPLANT
TOWEL OR 17X24 6PK STRL BLUE (TOWEL DISPOSABLE) ×2 IMPLANT
TRAY FOLEY CATH SILVER 14FR (SET/KITS/TRAYS/PACK) IMPLANT

## 2016-07-02 NOTE — Progress Notes (Signed)
Dr. Ernestina PennaFogleman at bedside discussing delivery via c/s.  Risks and benefits discussed.  Consents signed.

## 2016-07-02 NOTE — Progress Notes (Signed)
ANTIBIOTIC CONSULT NOTE - INITIAL  Pharmacy Consult for unasyn Indication: maternal infection/ maternal fever  No Known Allergies  Patient Measurements: Height: 5\' 8"  (172.7 cm) Weight: 210 lb (95.3 kg) IBW/kg (Calculated) : 63.9 Adjusted Body Weight:   Vital Signs: Temp: 100.5 F (38.1 C) (02/15 0514) Temp Source: Axillary (02/15 0514) BP: 118/68 (02/15 0531) Pulse Rate: 89 (02/15 0531) Intake/Output from previous day: 02/14 0701 - 02/15 0700 In: -  Out: 800 [Urine:800] Intake/Output from this shift: Total I/O In: -  Out: 800 [Urine:800]  Labs:  Recent Labs  07/01/16 0113  WBC 15.8*  HGB 11.6*  PLT 196   CrCl cannot be calculated (No order found.). No results for input(s): VANCOTROUGH, VANCOPEAK, VANCORANDOM, GENTTROUGH, GENTPEAK, GENTRANDOM, TOBRATROUGH, TOBRAPEAK, TOBRARND, AMIKACINPEAK, AMIKACINTROU, AMIKACIN in the last 72 hours.   Microbiology: No results found for this or any previous visit (from the past 720 hour(s)).  Medical History: Past Medical History:  Diagnosis Date  . GERD (gastroesophageal reflux disease)   . Hypothyroidism     Medications:  Prescriptions Prior to Admission  Medication Sig Dispense Refill Last Dose  . levothyroxine (SYNTHROID, LEVOTHROID) 200 MCG tablet Take 200 mcg by mouth daily before breakfast.   06/30/2016 at Unknown time  . levothyroxine (SYNTHROID, LEVOTHROID) 25 MCG tablet Take 12.5 mcg by mouth daily before breakfast.   06/30/2016 at Unknown time  . Prenatal Vit-Fe Fumarate-FA (PRENATAL MULTIVITAMIN) TABS tablet Take 1 tablet by mouth daily at 12 noon.   Past Week at Unknown time   Scheduled:  . ampicillin-sulbactam (UNASYN) IV  3 g Intravenous Q6H  . lactated ringers  500 mL Intravenous Once  . levothyroxine  212 mcg Oral QAC breakfast  . oxytocin 40 units in LR 1000 mL  500 mL Intravenous Once  . sodium phosphate  1 enema Rectal Once   Infusions:  . fentaNYL 2.5 mcg/ml w/bupivacaine 1/10% in NS 100ml epidural  infusion 14 mL/hr (07/01/16 2249)  . lactated ringers 125 mL/hr at 07/01/16 2123  . oxytocin 20 milli-units/min (07/02/16 0155)  . oxytocin     PRN: acetaminophen, acetaminophen, butorphanol, diphenhydrAMINE, ePHEDrine, ePHEDrine, fentaNYL 2.5 mcg/ml w/bupivacaine 1/10% in NS 100ml epidural infusion, lactated ringers, lidocaine (PF), ondansetron, oxyCODONE-acetaminophen, oxyCODONE-acetaminophen, phenylephrine, phenylephrine, sodium citrate-citric acid, terbutaline Anti-infectives    Start     Dose/Rate Route Frequency Ordered Stop   07/02/16 0530  Ampicillin-Sulbactam (UNASYN) 3 g in sodium chloride 0.9 % 100 mL IVPB     3 g 200 mL/hr over 30 Minutes Intravenous Every 6 hours 07/02/16 0517       Assessment: 36 yo G1P0 admitted at 331w4d for IOL for postdates. SROM at 1400. Concern for maternal infection given maternal temperature >100.4. Starting Unasyn  Goal of Therapy:  resolution of infection/ fever  Plan:  Unasyn 3 grams Q6 hours  Will follow clinically  Thank you.   Mary Griffin Scarlett 07/02/2016,5:52 AM

## 2016-07-02 NOTE — Op Note (Signed)
07/01/2016 - 07/02/2016  7:37 AM  PATIENT:  Mary LarocheAlisha Griffin  36 y.o. female  PRE-OPERATIVE DIAGNOSIS:  Primary cesarean section for arrest of descent; chorioamnionitis  POST-OPERATIVE DIAGNOSIS:  Primary cesarean section for arrest of descent; chorioamnionitis, persistent OP position  PROCEDURE:  Procedure(s): CESAREAN SECTION (N/A)  Primary cs, LTCS w/ 2 layer closure  SURGEON:  Surgeon(s) and Role:    * Noland FordyceKelly Deshay Kirstein, MD - Primary  PHYSICIAN ASSISTANT:   ASSISTANTS: none   ANESTHESIA:   epidural  EBL:  Total I/O In: 1400 [I.V.:1400] Out: 1300 [Urine:350; Blood:950]  BLOOD ADMINISTERED:none  DRAINS: Urinary Catheter (Foley)   LOCAL MEDICATIONS USED:  NONE  SPECIMEN:  Source of Specimen:  placenta  DISPOSITION OF SPECIMEN:  L&D  COUNTS:  YES  TOURNIQUET:  * No tourniquets in log *  DICTATION: .Note written in EPIC  PLAN OF CARE: Admit to inpatient   PATIENT DISPOSITION:  PACU - hemodynamically stable.   Delay start of Pharmacological VTE agent (>24hrs) due to surgical blood loss or risk of bleeding: yes    Findings:  @BABYSEXEBC @ infant,  APGAR (1 MIN): 9   APGAR (5 MINS): 9   APGAR (10 MINS):   Normal uterus, tubes and ovaries, normal placenta. 3VC, thick meconium stained amniotic fluid  EBL: 950 cc Antibiotics:  3g Unasyn Complications: none  Indications: This is a 36 y.o. year-old, G1  At 6463w6d admitted for post-dates IOL. Pt has slow progression to 8 cm then regressed to 7cm with cervical edema and never progressed past 0 station. Decision for PCS. Risks benefits and alternatives of the procedure were discussed with the patient who agreed to proceed  Procedure:  After informed consent was obtained the patient was taken to the operating room where epiduarl anesthesia was found to be adequate.  She was prepped and draped in the normal sterile fashion in dorsal supine position with a leftward tilt.  A foley catheter was in place.  A Pfannenstiel skin  incision was made 2 cm above the pubic symphysis in the midline with the scalpel.  Dissection was carried down with the Bovie cautery until the fascia was reached. The fascia was incised in the midline. The incision was extended laterally with the Mayo scissors. The inferior aspect of the fascial incision was grasped with the Coker clamps, elevated up and the underlying rectus muscles were dissected off sharply. The superior aspect of the fascial incision was grasped with the Coker clamps elevated up and the underlying rectus muscles were dissected off sharply.  The peritoneum was entered bluntly. The peritoneal incision was extended superiorly and inferiorly with good visualization of the bladder. The bladder blade was inserted and palpation was done to assess the fetal position and the location of the uterine vessels. The lower segment of the uterus was incised sharply with the scalpel and extended  bluntly in the cephalo-caudal fashion. The infant was grasped, brought to the incision,  rotated and the infant was delivered with fundal pressure. The nose and mouth were bulb suctioned. The cord was clamped and cut after 1 minute delay. The infant was handed off to the waiting pediatrician. The placenta was expressed. The uterus was exteriorized. The uterus was cleared of all clots and debris. The uterine incision was repaired with 0 Vicryl in a running locked fashion.  A second layer of the same suture was used in an imbricating fashion to obtain excellent hemostasis. A single figure of 8 was placed in the midpoint. The uterus was then returned to  the abdomen, the gutters were cleared of all clots and debris. The uterine incision was reinspected and found to be hemostatic. The peritoneum was grasped and closed with 2-0 Vicryl in a running fashion. The cut muscle edges and the underside of the fascia were inspected and found to be hemostatic. The fascia was closed with 0 Vicryl in a single layer. The subcutaneous  tissue was irrigated. Scarpa's layer was closed with a 2-0 plain gut suture. The skin was closed with a 4-0 Monocryl in a single layer. The patient tolerated the procedure well. Sponge lap and needle counts were correct x3 and patient was taken to the recovery room in a stable condition.  Mary Griffin A. 07/02/2016 7:40 AM

## 2016-07-02 NOTE — Progress Notes (Signed)
CTSP by nurse for increased post-op N/V  Normal post-op nausea meds had been d/c'd. Gave order for Zofan and Pepcid IV but pt with continued emesis. Order for 1L IV bolus, CBC, CMP and Phenergan given  Pt notes no emesis since Phenergan, Sleepy, Pain 'manageable"  O: Vitals:   07/02/16 0856 07/02/16 1000 07/02/16 1130 07/02/16 1230  BP: (!) 114/57 (!) 112/56 (!) 94/51 (!) 102/54  Pulse: 88 79 76 70  Resp:  18 18 18   Temp: 98.6 F (37 C) 98.3 F (36.8 C) 97.8 F (36.6 C) 97.7 F (36.5 C)  TempSrc: Oral Oral Oral Oral  SpO2:  100% 97% 96%  Weight:      Height:       Gen: sleepy, slightly pale, mild edema (noticed pre-op) Abd: soft distenstion, no rebound, no guarding, mild tympany, fundus firm at umbilicus and NT, no RUQ pain Gu: appropriate lochi. Foley with darker urine, no blood  CBC Latest Ref Rng & Units 07/02/2016 07/01/2016  WBC 4.0 - 10.5 K/uL 26.3(H) 15.8(H)  Hemoglobin 12.0 - 15.0 g/dL 4.0(J9.4(L) 11.6(L)  Hematocrit 36.0 - 46.0 % 26.7(L) 33.5(L)  Platelets 150 - 400 K/uL 149(L) 196    CMP     Component Value Date/Time   NA 135 07/02/2016 1225   K 3.5 07/02/2016 1225   CL 106 07/02/2016 1225   CO2 23 07/02/2016 1225   GLUCOSE 145 (H) 07/02/2016 1225   BUN 8 07/02/2016 1225   CREATININE 0.65 07/02/2016 1225   CALCIUM 7.1 (L) 07/02/2016 1225   PROT 5.1 (L) 07/02/2016 1225   ALBUMIN 2.2 (L) 07/02/2016 1225   AST 21 07/02/2016 1225   ALT 12 (L) 07/02/2016 1225   ALKPHOS 108 07/02/2016 1225   BILITOT 0.5 07/02/2016 1225   GFRNONAA >60 07/02/2016 1225   GFRAA >60 07/02/2016 1225    A/P: POD #0 s/p PCS for arrest with increased N/V post-op, little response to Zofran but now sx improved after Phenergan. Labs and exam appropriate for post-op status. No signs of surgical injury or significant bleeding. Continue close observation. NPO until feeling better. Allow maternal rest.   Hazel Wrinkle A. 07/02/2016 1:29 PM

## 2016-07-02 NOTE — Transfer of Care (Signed)
Immediate Anesthesia Transfer of Care Note  Patient: Mary LarocheAlisha Griffin  Procedure(s) Performed: Procedure(s): CESAREAN SECTION (N/A)  Patient Location: PACU  Anesthesia Type:Epidural  Level of Consciousness: awake  Airway & Oxygen Therapy: Patient Spontanous Breathing  Post-op Assessment: Report given to RN  Post vital signs: Reviewed and stable  Last Vitals:  Vitals:   07/02/16 0601 07/02/16 0624  BP: 124/65 119/63  Pulse: (!) 104 100  Resp: 16 16  Temp:  37.6 C    Last Pain:  Vitals:   07/02/16 0624  TempSrc: Oral  PainSc:          Complications: No apparent anesthesia complications

## 2016-07-02 NOTE — Anesthesia Postprocedure Evaluation (Addendum)
Anesthesia Post Note  Patient: Web designerAlisha Gutter  Procedure(s) Performed: Procedure(s) (LRB): CESAREAN SECTION (N/A)  Patient location during evaluation: Mother Baby Anesthesia Type: Epidural Level of consciousness: awake Pain management: satisfactory to patient Vital Signs Assessment: post-procedure vital signs reviewed and stable Respiratory status: spontaneous breathing Cardiovascular status: stable Anesthetic complications: no        Last Vitals:  Vitals:   07/02/16 1230 07/02/16 1400  BP: (!) 102/54 (!) 118/58  Pulse: 70 79  Resp: 18 18  Temp: 36.5 C 36.7 C    Last Pain:  Vitals:   07/02/16 1400  TempSrc: Oral  PainSc:    Pain Goal:                 KeyCorpBURGER,LINDA

## 2016-07-02 NOTE — Brief Op Note (Signed)
07/01/2016 - 07/02/2016  7:37 AM  PATIENT:  Caro LarocheAlisha Kerin  36 y.o. female  PRE-OPERATIVE DIAGNOSIS:  Primary cesarean section for arrest of descent; chorioamnionitis  POST-OPERATIVE DIAGNOSIS:  Primary cesarean section for arrest of descent; chorioamnionitis, persistent OP position  PROCEDURE:  Procedure(s): CESAREAN SECTION (N/A)  Primary cs, LTCS w/ 2 layer closure  SURGEON:  Surgeon(s) and Role:    * Noland FordyceKelly Dorthy Hustead, MD - Primary  PHYSICIAN ASSISTANT:   ASSISTANTS: none   ANESTHESIA:   epidural  EBL:  Total I/O In: 1400 [I.V.:1400] Out: 1300 [Urine:350; Blood:950]  BLOOD ADMINISTERED:none  DRAINS: Urinary Catheter (Foley)   LOCAL MEDICATIONS USED:  NONE  SPECIMEN:  Source of Specimen:  placenta  DISPOSITION OF SPECIMEN:  L&D  COUNTS:  YES  TOURNIQUET:  * No tourniquets in log *  DICTATION: .Note written in EPIC  PLAN OF CARE: Admit to inpatient   PATIENT DISPOSITION:  PACU - hemodynamically stable.   Delay start of Pharmacological VTE agent (>24hrs) due to surgical blood loss or risk of bleeding: yes   Kadden Osterhout A. 07/02/2016 7:38 AM

## 2016-07-02 NOTE — Progress Notes (Signed)
S: Doing well, no complaints, pain well controlled with epidural. Pt now feeling "hot and puffy"  O: BP 118/68   Pulse 89   Temp (!) 100.5 F (38.1 C) (Axillary)   Resp 18   Ht 5\' 8"  (1.727 m)   Wt 95.3 kg (210 lb)   SpO2 98%   BMI 31.93 kg/m    FHT:  FHR: 140s bpm, variability: moderate,  accelerations:  Present,  decelerations:  Absent UC:   regular, every 3 minutes SVE:   Dilation: 7 Effacement (%): 90 Station: 0, -1 Exam by:: Tearra Ouk   A / P:  36 y.o.  Obstetric History   G1   P0   T0   P0   A0   L0    SAB0   TAB0   Ectopic0   Multiple0   Live Births0    at 2010w6d Post dates IOL, s/p cytotec x 2 then started pitocin. SROM. IUPC placed at 4 cm after slow progress. Pt made continued but slow progress to 8 cm, now regressed to 7cm with cervical swelling, hematuria and fetal caput but no descent past 0 station.  Fetal Wellbeing:  Category I Pain Control:  Epidural  Anticipated MOD:  PCS for arrest  Mary Griffin A. 07/02/2016, 6:04 AM

## 2016-07-02 NOTE — Anesthesia Postprocedure Evaluation (Signed)
Anesthesia Post Note  Patient: [name]  Procedure(s) Performed: Procedure(s) (LRB): CESAREAN SECTION (N/A)  Patient location during evaluation: PACU Anesthesia Type: Epidural Level of consciousness: awake Pain management: pain level controlled Vital Signs Assessment: post-procedure vital signs reviewed and stable Respiratory status: spontaneous breathing Cardiovascular status: stable Postop Assessment: no headache, no backache, epidural receding, patient able to bend at knees and no signs of nausea or vomiting Anesthetic complications: no        Last Vitals:  Vitals:   07/02/16 0758 07/02/16 0800  BP:  93/73  Pulse: (!) 118 (!) 112  Resp: (!) 22 19  Temp: 37.1 C     Last Pain:  Vitals:   07/02/16 0758  TempSrc: Oral  PainSc:    Pain Goal:                 Deveon Kisiel JR,JOHN Yahaira Bruski

## 2016-07-02 NOTE — Addendum Note (Signed)
Addendum  created 07/02/16 1543 by Algis GreenhouseLinda A Jeovanny Cuadros, CRNA   Sign clinical note

## 2016-07-03 ENCOUNTER — Encounter (HOSPITAL_COMMUNITY): Payer: Self-pay

## 2016-07-03 DIAGNOSIS — D62 Acute posthemorrhagic anemia: Secondary | ICD-10-CM | POA: Diagnosis not present

## 2016-07-03 HISTORY — DX: Acute posthemorrhagic anemia: D62

## 2016-07-03 LAB — CBC
HCT: 23.6 % — ABNORMAL LOW (ref 36.0–46.0)
HEMOGLOBIN: 8.5 g/dL — AB (ref 12.0–15.0)
MCH: 32.2 pg (ref 26.0–34.0)
MCHC: 36 g/dL (ref 30.0–36.0)
MCV: 89.4 fL (ref 78.0–100.0)
Platelets: 136 10*3/uL — ABNORMAL LOW (ref 150–400)
RBC: 2.64 MIL/uL — ABNORMAL LOW (ref 3.87–5.11)
RDW: 14 % (ref 11.5–15.5)
WBC: 17 10*3/uL — ABNORMAL HIGH (ref 4.0–10.5)

## 2016-07-03 MED ORDER — MAGNESIUM OXIDE 400 (241.3 MG) MG PO TABS
400.0000 mg | ORAL_TABLET | Freq: Every day | ORAL | Status: DC
  Administered 2016-07-03 – 2016-07-04 (×2): 400 mg via ORAL
  Filled 2016-07-03 (×3): qty 1

## 2016-07-03 MED ORDER — POLYSACCHARIDE IRON COMPLEX 150 MG PO CAPS
150.0000 mg | ORAL_CAPSULE | Freq: Two times a day (BID) | ORAL | Status: DC
  Administered 2016-07-03 – 2016-07-04 (×3): 150 mg via ORAL
  Filled 2016-07-03 (×3): qty 1

## 2016-07-03 NOTE — Progress Notes (Addendum)
Patient ID: Mary Griffin, female   DOB: 1980-10-09, 36 y.o.   MRN: [id number] Subjective: S/P Primary Cesarean Delivery d/t Arrest of Dilation POD# 1 Information for the patient's newborn:  Mary Griffin, [place] [[id number]][[id number]]  female "Mary Griffin"  breastfeeding / circ planning  Reports feeling well. Feeding: breast Patient reports tolerating PO.  Breast symptoms: (+) colostrum Pain controlled with ibuprofen (OTC) and narcotic analgesics including Percocet Denies HA/SOB/C/P/N/V/dizziness. Flatus present. No BM. She reports vaginal bleeding as normal, without clots.  She is ambulating, urinating without difficult.     Objective:   VS:  Vitals:   07/02/16 1800 07/02/16 2000 07/03/16 0000 07/03/16 0400  BP: 115/64  (!) 104/56 116/62  Pulse: 92  84 75  Resp: 20  18 18   Temp: 98.4 F (36.9 C) 98 F (36.7 C) 98.4 F (36.9 C) 98.2 F (36.8 C)  TempSrc: Oral Axillary Oral Oral  SpO2: 97% 94% 98%   Weight:      Height:         Intake/Output Summary (Last 24 hours) at 07/03/16 0835 Last data filed at 07/03/16 0515  Gross per 24 hour  Intake          2080.42 ml  Output             2625 ml  Net          -544.58 ml        Recent Labs  07/02/16 1225 07/03/16 0547  WBC 26.3* 17.0*  HGB 9.4* 8.5*  HCT 26.7* 23.6*  PLT 149* 136*     Blood type: A POS (02/14 0113)  Rubella: Immune (09/15 0000)     Physical Exam:   General: alert, cooperative and no distress  CV: Regular rate and rhythm, S1S2 present or without murmur or extra heart sounds  Resp: clear  Abdomen: soft, nontender, normal bowel sounds  Incision: dry, intact and bloody drainage present / skin well-approximated with suture  Uterine Fundus: firm, 1 FB below umbilicus, nontender  Lochia: minimal  Ext: extremities normal, atraumatic, no cyanosis or edema, Homans sign is negative, no sign of DVT and no edema, redness or tenderness in the calves or thighs   Assessment/Plan: 36 y.o.   POD# 1. G1P1001 S/P Cesarean Delivery.   Indications: arrest of dilation                Principal Problem:   Postpartum care following cesarean delivery / Indication: Arrest of dilation 2/15 Active Problems:   Pregnancy   Postoperative anemia due to acute blood loss  Doing well, stable.               Regular diet as tolerated Ambulate Routine post-op care Start Fe supplements BID and Magnesium Oxide 400 mg daily Change HC dressing  Kenard GowerAWSON, Keyona Emrich, M, MSN, CNM 07/03/2016, 8:35 AM

## 2016-07-03 NOTE — Lactation Note (Signed)
This note was copied from a baby's chart. Lactation Consultation Note  Patient Name: Boy Caro Larochelisha Wise RUEAV'WToday's Date: 07/03/2016 Reason for consult: Initial assessment Assisted Mom with positioning and obtaining good depth with latch. Baby demonstrated some good suckling bursts with swallows noted, some intermittent chewing. Demonstrated how to un-tuck lower lip for more depth. Mom reports some nipple tenderness, no breakdown noted. Advised to apply EBM, asked RN to bring Mom coconut oil. Basic teaching reviewed with Mom. Advised baby should be at breast 8-12 times in 24 hours and with feeding ques. Cluster feeding discussed. Lactation brochure left for review, advised of OP services and support group. Mom has history of thyroidectomy, Mom reports her TSH, T3T4 have been normal, she is followed by endocrinologist. Mom reports her labs will be evaluated again postpartum. Advised Mom to be sure labs are within normal range. Encouraged Mom to call for assist as needed with latch, questions/concerns.   Maternal Data Has patient been taught Hand Expression?: Yes Does the patient have breastfeeding experience prior to this delivery?: No  Feeding Feeding Type: Breast Fed Length of feed: 15 min  LATCH Score/Interventions Latch: Grasps breast easily, tongue down, lips flanged, rhythmical sucking. Intervention(s): Adjust position;Assist with latch;Breast massage;Breast compression  Audible Swallowing: A few with stimulation  Type of Nipple: Everted at rest and after stimulation  Comfort (Breast/Nipple): Filling, red/small blisters or bruises, mild/mod discomfort  Problem noted: Mild/Moderate discomfort Interventions (Mild/moderate discomfort): Hand expression (EBM to nipples, coconut oil prn)  Hold (Positioning): Assistance needed to correctly position infant at breast and maintain latch.  LATCH Score: 7  Lactation Tools Discussed/Used WIC Program: Yes   Consult Status Consult Status:  Follow-up Date: 07/04/16 Follow-up type: In-patient    Alfred LevinsGranger, Sherod Cisse Ann 07/03/2016, 11:59 AM

## 2016-07-04 DIAGNOSIS — E039 Hypothyroidism, unspecified: Secondary | ICD-10-CM | POA: Diagnosis present

## 2016-07-04 MED ORDER — SIMETHICONE 80 MG PO CHEW: 80.0000 mg | CHEWABLE_TABLET | Freq: Three times a day (TID) | ORAL | 0 refills | Status: DC

## 2016-07-04 MED ORDER — MAGNESIUM OXIDE 400 (241.3 MG) MG PO TABS: 400.0000 mg | ORAL_TABLET | Freq: Every day | ORAL | Status: DC

## 2016-07-04 MED ORDER — COCONUT OIL OIL: 1.0000 "application " | TOPICAL_OIL | 0 refills | Status: DC | PRN

## 2016-07-04 MED ORDER — POLYSACCHARIDE IRON COMPLEX 150 MG PO CAPS: 150.0000 mg | ORAL_CAPSULE | Freq: Two times a day (BID) | ORAL | 1 refills | Status: DC

## 2016-07-04 MED ORDER — SENNOSIDES-DOCUSATE SODIUM 8.6-50 MG PO TABS: 2.0000 | ORAL_TABLET | ORAL | Status: DC

## 2016-07-04 MED ORDER — LEVOTHYROXINE SODIUM 200 MCG PO TABS: 200.0000 ug | ORAL_TABLET | Freq: Every day | ORAL | 0 refills | Status: DC

## 2016-07-04 MED ORDER — IBUPROFEN 600 MG PO TABS: 600.0000 mg | ORAL_TABLET | Freq: Four times a day (QID) | ORAL | 0 refills | Status: DC

## 2016-07-04 NOTE — Lactation Note (Addendum)
This note was copied from a baby's chart. Lactation Consultation Note  Patient Name: Boy Caro Larochelisha Oleary ZOXWR'UToday's Date: 07/04/2016 Reason for consult: Follow-up assessment Mom reports baby cluster feeding, nursing frequently. She reports some nipple tenderness, no breakdown reported. Apply EBM/coconut oil prn. Per chart review, baby has been to breast 12 times in past 24 hours for 14-60 minutes, 2 voids/0 stools in 24 hours but has had 7 voids/8 stools in life. Baby now 6252 hours old. Advised Mom to continue to BF with feeding ques, 8-12 times or more in 24 hours, keep baby nursing for 15-20 minute both breasts when possible.  Discussed due to thyroid history, pumping 4 times per day for 15-20 minutes to maximize milk production and to have EBM to supplement. Baby at 7.4% weight loss with 3.4% in past 24 hours. Mom reports having breast pump at home. Engorgement care reviewed if needed. Offered to assist with latch with next feeding before d/c home, Mom declined. Advised of OP services and support group. Reviewed normal output and to call Peds if not adequate. Peds f/u Monday per Mom. Latch score per RN =10 last evening.   Maternal Data    Feeding Feeding Type: Breast Milk Length of feed: 20 min  LATCH Score/Interventions                      Lactation Tools Discussed/Used     Consult Status Consult Status: Complete Date: 07/04/16 Follow-up type: In-patient    Alfred LevinsGranger, Saleh Ulbrich Ann 07/04/2016, 11:59 AM

## 2016-07-04 NOTE — Progress Notes (Signed)
Subjective: POD# 2 Information for the patient's newborn:  Mary Griffin, Boy Mary Griffin [[id number]][[id number]]  female   circ done  Baby name: Mary Griffin  Reports feeling sore when getting up but overall well.  Ready to go home.  Feeding: breast; has seen lactation consultant, but has no questions at this time. Patient reports tolerating PO.  Breast symptoms:none Pain controlled withibuprofen and ocassional Percocet Denies HA/SOB/N/V/dizziness. Flatus present., BM yesterday She reports vaginal bleeding as normal, without clots.   She is ambulating, urinating without difficulty.     Objective:   VS:    Vitals:   07/03/16 0000 07/03/16 0400 07/03/16 1838 07/04/16 0551  BP: (!) 104/56 116/62 125/70 134/72  Pulse: 84 75 82 86  Resp: 18 18 16 18   Temp: 98.4 F (36.9 C) 98.2 F (36.8 C) 98.2 F (36.8 C) 97.6 F (36.4 C)  TempSrc: Oral Oral Oral Oral  SpO2: 98%     Weight:      Height:        No intake or output data in the 24 hours ending 07/04/16 0946      Recent Labs  07/02/16 1225 07/03/16 0547  WBC 26.3* 17.0*  HGB 9.4* 8.5*  HCT 26.7* 23.6*  PLT 149* 136*     Blood type: --/--/A POS, A POS (02/14 0113)  Rubella: Immune (09/15 0000)     Physical Exam:  General: alert, cooperative and no distress CV: Regular rate and rhythm, S1S2 present or without murmur or extra heart sounds Resp: clear Abdomen: soft, nontender, normal bowel sounds Incision: clean, dry and dark, bloody drainage present Uterine Fundus: firm, below umbilicus, nontender Lochia: minimal Ext: edema 2+ pedal, Homans sign is negative, no sign of DVT, redness or tenderness in the calves or thighs   Assessment/Plan: 36 y.o.   POD# 2. G1P1001                  Principal Problem:   Postpartum care following cesarean delivery / Indication: Arrest of dilation 2/15 Active Problems:   Postoperative anemia due to acute blood loss   Hypothyroidism   Doing well, stable.          Encourage rest when baby rests Encourage to  ambulate Routine post-op care  Breastfeeding support - contact outpatient lactation consultant if any concerns arise. Continue oral Fe and Mag oxide supplements for one month. Anticipate discharge this afternoon.  Kathlene Novemberinthya Bowmaker-Kareem, BSN, SNM 07/04/2016, 9:46 AM

## 2016-07-04 NOTE — Discharge Summary (Signed)
OB Discharge Summary     Patient Name: Mary Griffin DOB: [birth year] MRN: [id number]  Date of admission: 07/01/2016 Delivering MD: Noland Fordyce   Date of discharge: 07/04/2016  Admitting diagnosis: [redacted]w[redacted]d Induction Intrauterine pregnancy: [redacted]w[redacted]d     Secondary diagnosis:  Principal Problem:   Postpartum care following cesarean delivery / Indication: Arrest of dilation 2/15 Active Problems:   Postoperative anemia due to acute blood loss   Hypothyroidism  Additional problems: none     Discharge diagnosis: Term Pregnancy Delivered and Anemia and hypothyroidism                                                                                               Post partum procedures:none  Augmentation: Pitocin and Cytotec  Complications: Intrauterine Inflammation or infection (Chorioamniotis)  Hospital course:  Induction of Labor With Cesarean Section  36 y.o. yo G1P1001 at [redacted]w[redacted]d was admitted to the hospital 07/01/2016 for induction of labor. Patient had a labor course significant for arrest of descent at 8cm and maternal temperature, suspicion of chorioamnionitis received IV Unasyn 3mg  The patient went for cesarean section due to Arrest of Dilation and Arrest of Descent,and delivered a Viable infant female. Membrane Rupture Time/Date: )1:25 PM ,07/01/2016   @Details  of operation can be found in separate operative Note.  Patient postpartum course complicated by iron deficiency anemia with superimposed acute blood loss anemia, asymptomatic, started on oral iron supplementation. Preoperative hemoglobin of 9.4, postoperative hemoglobin 8.5. She is ambulating, tolerating a regular diet, passing flatus, and urinating well.  Patient is discharged home in stable condition on 07/04/16.                                   Physical exam  Vitals:   07/03/16 0000 07/03/16 0400 07/03/16 1838 07/04/16 0551  BP: (!) 104/56 116/62 125/70 134/72  Pulse: 84 75 82 86  Resp: 18 18 16 18   Temp: 98.4 F (36.9 C)  98.2 F (36.8 C) 98.2 F (36.8 C) 97.6 F (36.4 C)  TempSrc: Oral Oral Oral Oral  SpO2: 98%     Weight:      Height:       General: alert, cooperative and no distress Lochia: appropriate Uterine Fundus: firm Incision: Healing well with no significant drainage, No significant erythema DVT Evaluation: No evidence of DVT seen on physical exam. Negative Homan's sign. No cords or calf tenderness. 2+ pedal edema  Labs: Lab Results  Component Value Date   WBC 17.0 (H) 07/03/2016   HGB 8.5 (L) 07/03/2016   HCT 23.6 (L) 07/03/2016   MCV 89.4 07/03/2016   PLT 136 (L) 07/03/2016   CMP Latest Ref Rng & Units 07/02/2016  Glucose 65 - 99 mg/dL 409(W)  BUN 6 - 20 mg/dL 8  Creatinine 1.19 - 1.47 mg/dL 8.29  Sodium 562 - 130 mmol/L 135  Potassium 3.5 - 5.1 mmol/L 3.5  Chloride 101 - 111 mmol/L 106  CO2 22 - 32 mmol/L 23  Calcium 8.9 - 10.3 mg/dL 7.1(L)  Total Protein 6.5 -  8.1 g/dL 5.1(L)  Total Bilirubin 0.3 - 1.2 mg/dL 0.5  Alkaline Phos 38 - 126 U/L 108  AST 15 - 41 U/L 21  ALT 14 - 54 U/L 12(L)    Discharge instruction: per After Visit Summary and "Baby and Me Booklet".  After visit meds:  Allergies as of 07/04/2016   No Known Allergies     Medication List    TAKE these medications   coconut oil Oil Apply 1 application topically as needed.   ibuprofen 600 MG tablet Commonly known as:  ADVIL,MOTRIN Take 1 tablet (600 mg total) by mouth every 6 (six) hours.   iron polysaccharides 150 MG capsule Commonly known as:  NIFEREX Take 1 capsule (150 mg total) by mouth 2 (two) times daily.   levothyroxine 200 MCG tablet Commonly known as:  SYNTHROID, LEVOTHROID Take 200 mcg by mouth daily before breakfast. What changed:  Another medication with the same name was changed. Make sure you understand how and when to take each.   levothyroxine 200 MCG tablet Commonly known as:  SYNTHROID, LEVOTHROID Take 1 tablet (200 mcg total) by mouth daily before breakfast. What  changed:  medication strength  how much to take   magnesium oxide 400 (241.3 Mg) MG tablet Commonly known as:  MAG-OX Take 1 tablet (400 mg total) by mouth daily.   prenatal multivitamin Tabs tablet Take 1 tablet by mouth daily at 12 noon.   senna-docusate 8.6-50 MG tablet Commonly known as:  Senokot-S Take 2 tablets by mouth daily. Start taking on:  07/05/2016   simethicone 80 MG chewable tablet Commonly known as:  MYLICON Chew 1 tablet (80 mg total) by mouth 3 (three) times daily after meals.       Diet: routine diet  Activity: Advance as tolerated. Pelvic rest for 6 weeks.   Outpatient follow up:6 weeks Follow up Visit:[facility] w[facility]  Postpartum contraception: Not Discussed  Newborn Data: Live born female - [name] Birth Weight: 9 lb 2.2 oz (4145 g) APGAR: 9, 9  Baby Feeding: Breast Disposition:home with mother   07/04/2016 Kathlene Novemberinthya Bowmaker-Kareem, Student-MidWife

## 2016-07-04 NOTE — Discharge Summary (Deleted)
Obstetric Discharge Summary Reason for Admission: induction of labor - late term Prenatal Procedures: ultrasound Intrapartum Procedures: cesarean: low cervical, transverse, epidural Postpartum Procedures: none Complications-Operative and Postpartum: ABL anemia Hemoglobin  Date Value Ref Range Status  07/03/2016 8.5 (L) 12.0 - 15.0 g/dL Final   HCT  Date Value Ref Range Status  07/03/2016 23.6 (L) 36.0 - 46.0 % Final    Physical Exam:  General: alert, cooperative and no distress Lochia: appropriate Uterine Fundus: firm, U-2 Incision: healing well, no significant drainage, no dehiscence, no significant erythema DVT Evaluation: No evidence of DVT seen on physical exam. Negative Homan's sign. No cords or calf tenderness. 2+ pedal edema  Discharge Diagnoses: Term Pregnancy-delivered  Discharge Information: Date: 07/04/2016 Activity: restrict lifting to less than 10 lbs, pelvic rest Diet: routine Medications: PNV, Ibuprofen and Percocet, synthroid Condition: stable Instructions: refer to practice specific booklet Discharge to: home Follow-up Information    FOGLEMAN,KELLY A., MD Follow up in 6 week(s).   Specialty:  Obstetrics and Gynecology Contact information: [address] [place] [address] [phone]           Newborn Data: Live born female - [name] Birth Weight: 9 lb 2.2 oz (4145 g) APGAR: 9, 9  Home with mother.  Kathlene Novemberinthya Bowmaker-Kareem, BSN, SNM 07/04/2016, 9:57 AM

## 2016-07-04 NOTE — Progress Notes (Signed)
Discharge education complete; discharge instructions and follow up appointment discussed. Patient verbalized understanding.

## 2016-07-05 ENCOUNTER — Telehealth (HOSPITAL_COMMUNITY): Payer: Self-pay | Admitting: Lactation Services

## 2016-07-05 NOTE — Telephone Encounter (Signed)
Elease Hashimotolisha called regarding her baby not having a stool in last 2 days.  Baby had last stool 4 am 07/03/16.  Mom reports 3 voids in last 24 hrs, (pale yellow color), abdomen feels soft, and baby is acting fine.  Mom reports >8 feedings in last 24 hrs, latch is comfortable.  Mom unsure if baby is swallowing, but thinks he is.  Baby was given 2 oz of formula last night, per MD request on discharge if baby hadn't stooled.  Mom "tried" pumping last night, but didn't get anything.  Has a DEBP at home.  Risk factor of hypothyroidism in Mom, being treated by Endocrinologist.   Recommendations- 1- Call her Pediatrician to update and receive advice 2- Continue feeding baby often on cue STS (8-12 feedings per 24 hrs) 3- Pump both breasts 15-20 mins after breastfeeding to stimulate her milk volume  4- feed baby EBM by spoon, bottle or dropper 5- Call Lactation for any further concerns.

## 2016-07-08 ENCOUNTER — Ambulatory Visit: Payer: Self-pay

## 2016-07-08 NOTE — Lactation Note (Signed)
This note was copied from a baby's chart. Lactation Consult  Mother's reason for visit:  Latch, pain, timing Visit Type:  Feeding assessment Appointment Notes:  Needs help with latching Consult:  Initial Lactation Consultant:  Huston FoleyMOULDEN, Nani Ingram S  ________________________________________________________________________ Mary Griffin DOB : [birth year] BIRTH WEIGHT: 9-2.2 WEIGHT TODAY: 8-6  ________________________________________________________________________  Mother's Name: Alfonse AlpersAlisha Griffin Type of delivery:  C-Section, Low Transverse Breastfeeding Experience:  FIRST BABY Maternal Medical Conditions:  Thyroid   ________________________________________________________________________  Breastfeeding History (Post Discharge)  Frequency of breastfeeding:  10 TIMES/24 HOURS Duration of feeding:  15-35 MINUTES  Patient does not supplement or pump.  Infant Intake and Output Assessment  Voids:  7  in 24 hrs.  Color:  Clear yellow Stools:  5 in 24 hrs.  Color:  Yellow  ________________________________________________________________________  Maternal Breast Assessment  Breast:  Full Nipple:  Erect and Cracked  Pain interventions:  Comfort gels and Expressed breast milk  _______________________________________________________________________ Feeding Assessment/Evaluation  Mom and 6 day old baby here for feeding assessment.  Mom states milk came in 2 days ago.  Baby is latching easily but mom states she still has pain at times.  Both nipples have abraded tips.  Observed mom latch baby to both breasts using cross cradle hold.  FOB shown how to compress breast tissue for deeper latch.  Mom has good technique but hesitates bringing baby on after wide open mouth.  Reviewed bringing baby quickly to breast and hold him close during feeding.  Baby obtained a deep latch each time but bottom lip needed to be untucked.  Parents shown how to do this.  Mom feeling some pinching at the end of  feedings.  Nipple round after baby came off.  Mom using comfort gels and EBM to nipples.  Questions answered.  Mom praised for all her efforts.  Lactation outpatient services and support information reviewed and encouraged.  Initial feeding assessment:  Infant's oral assessment:  WNL  Positioning:  Cross cradle Right breast/LEFT BREAST  LATCH documentation:  Latch:  2 = Grasps breast easily, tongue down, lips flanged, rhythmical sucking.  Audible swallowing:  2 = Spontaneous and intermittent  Type of nipple:  2 = Everted at rest and after stimulation  Comfort (Breast/Nipple):  1 = Filling, red/small blisters or bruises, mild/mod discomfort  Hold (Positioning):  2 = No assistance needed to correctly position infant at breast  LATCH score:  9  Attached assessment:  Deep  Lips flanged:  No.  Lips untucked:  Yes.    Suck assessment:  Nutritive     Pre-feed weight:  3798 g   Post-feed weight:  3850 g  Amount transferred:  52 ml Amount supplemented:  0 ml

## 2016-07-20 NOTE — Telephone Encounter (Signed)
Mother's still complaining of abrasion and yeast on her nipples.  Right side is healing well but unable to tolerate L side.  Suggest calling ob for APNO and start pumping on L side until nipple is healed.  Lubricate flanges before pumping.  Discussed milk storage.  Offered OP appt but mother will call back if needed.

## 2016-08-06 ENCOUNTER — Ambulatory Visit: Payer: Self-pay

## 2016-08-06 NOTE — Lactation Note (Signed)
This note was copied from a baby's chart. Lactation Consult; Weight today 4520 g  9# 15.4 oz with clean diaper   Mom here today because she is still having lots of pain with breast feeding. Baby now 1005 Mary Griffin old. Has been treated for yeast (mom and baby) but no improvement. Baby latched well but starts chomping at breast after a few minutes. Mom reports little pain with initial latch that increases some as he nurses. When sucking on my gloved finger , he still does some tongue thrusting and does not extend his tongue very far past gumline. Does lift tongue but cupping noted. Suggested visiting a oral specialist for further exam. Dr Rachelle HoraHisaw's information given to parents to consider. Suggested another OP appointment if decides to visit him.. Encouragement given. Baby did latch to both breasts this feeding. Mom has not been putting him to right breast because it was so sore. No questions at present. To call prn  Mother's reason for visit:  Still having pain with breast feeding baby now 565 Mary Griffin old Visit Type:  Feeding assessment  Lactation Consultant:  Audry RilesWeeks, Niccolas Loeper D  ________________________________________________________________________    ________________________________________________________________________  Mother's Name: Al CorpusWilliam James Pangborn Type of delivery:  C-Section, Low Transverse Breastfeeding Experience:  P1   ________________________________________________________________________  Breastfeeding History (Post Discharge)  Frequency of breastfeeding:  q 2-3 hours Duration of feeding:  30 min  Supplementation  Formula:  Volume 0 ml   Breastmilk:  Volume 45 ml  Frequency:  q feeding   Method:  Bottle,   Pumping  Type of pump:  Medela pump in style Frequency:  Has been pumping right breast because it was too sore to latch baby to Volume:   1-2 oz  Infant Intake and Output Assessment  Voids:  8+ in 24 hrs.  Color:  Clear yellow had 2 voids while here for  appointment Stools:  1-2 in 24 hrs.  Color:  Yellow mom reports he has 1-2 large stools per day  ________________________________________________________________________  Maternal Breast Assessment  Breast:  Filling Nipple:  Erect and Reddened Pain level:  3 with initial latch but does increase some as feeding progresses Pain interventions:  All purpose nipple cream  _______________________________________________________________________ Feeding Assessment/Evaluation  Initial feeding assessment:  Infant's oral assessment:  Variance  Positioning:  Football Left breast  LATCH documentation:  Latch:  2 = Grasps breast easily, tongue down, lips flanged, rhythmical sucking.  Audible swallowing:  1 = A few with stimulation  Type of nipple:  2 = Everted at rest and after stimulation  Comfort (Breast/Nipple):  1 = Filling, red/small blisters or bruises, mild/mod discomfort  Hold (Positioning):  1 = Assistance needed to correctly position infant at breast and maintain latch  LATCH score:  7  Attached assessment:  Deep  Lips flanged:  Yes.    Lips untucked:  Yes.    Suck assessment:  Nutritive and Nonnutritive   Pre-feed weight:  4520 g  9 # 15.4 oz Post-feed weight:  4538 g 10 # 0.1 oz Amount transferred:  18 ml   Pre-feed weight:  4538 g 10 # 0.1 oz Post-feed weight:  4552 g 10 # 0.5 oz Amount transferred:  14 ml  Total amount pumped post feed:   Did not pump since he nursed on both breasts   Total amount transferred:  32 ml Total supplement given:  Had 45 ml about 1 hour before appointment because he was hungry

## 2017-01-06 NOTE — Addendum Note (Signed)
Addendum  created 01/06/17 1153 by Achille Rich, MD   Sign clinical note

## 2017-08-27 ENCOUNTER — Encounter: Payer: Self-pay | Admitting: Adult Health

## 2017-08-27 ENCOUNTER — Ambulatory Visit
Admission: RE | Admit: 2017-08-27 | Discharge: 2017-08-27 | Disposition: A | Discharge status: Home / Self Care | Payer: BLUE CROSS/BLUE SHIELD | Source: Ambulatory Visit | Attending: Adult Health | Admitting: Adult Health

## 2017-08-27 ENCOUNTER — Ambulatory Visit: Payer: Self-pay | Admitting: Adult Health

## 2017-08-27 VITALS — BP 131/74 | HR 61 | Temp 98.7°F | Resp 16 | Ht 68.0 in | Wt 178.0 lb

## 2017-08-27 DIAGNOSIS — M79671 Pain in right foot: Secondary | ICD-10-CM | POA: Insufficient documentation

## 2017-08-27 DIAGNOSIS — M7731 Calcaneal spur, right foot: Secondary | ICD-10-CM | POA: Diagnosis not present

## 2017-08-27 MED ORDER — PREDNISONE 10 MG (21) PO TBPK: ORAL_TABLET | ORAL | 0 refills | Status: AC

## 2017-08-27 NOTE — Progress Notes (Signed)
Subjective:     Patient ID: Mary Griffin, female   DOB: 12-14-1980, 37 y.o.   MRN: [id number]  HPI Patient is a 37 year old female in no acute distress who comes to the clinic with complaint of  Right foot x 2  1/2 months.  Denies any known injury.  She was running two weeks ago to see if symptoms would improve.  She has been exercising more for the past five to 6 months she reports.  She reports having good support shoes with inserts, doing all of the stretches for plantar fascitis she learned previously when she had it - she  does not remember which foot she had this in 2- 3 years ago.  Denies heel pain, pain is relieved with rest. Pain is worse with no shoes walking. Right lateral side of proximal foot painful as well.   She has taken 200 mg of Motrin occasionally for pain.   She has seen orthopedics in past for right knee pain " years ago". Issue was resolved/   Patient  denies any fever, body aches,chills, rash, chest pain, shortness of breath, nausea, vomiting, or diarrhea.    Patient's last menstrual period was 08/04/2017.  C- Section delivery on 07/02/16- not lactating.   Blood pressure 131/74, pulse 61, temperature 98.7 F (37.1 C), resp. rate 16, height 5\' 8"  (1.727 m), weight 178 lb (80.7 kg), last menstrual period 08/04/2017, SpO2 99 %, unknown if currently breastfeeding.   Review of Systems  Constitutional: Negative.   HENT: Negative.   Respiratory: Negative.   Cardiovascular: Negative.   Gastrointestinal: Negative.   Genitourinary: Negative.   Musculoskeletal: Positive for gait problem (right foot pain with walking). Negative for arthralgias, back pain, joint swelling, myalgias, neck pain and neck stiffness.       Reports " maybe " mild swelling right foot intermittently, pain right side of foot. Mild pain in heel while walking. Denies pain with sitting - only pain with touching and walking.   Allergic/Immunologic: Negative.        No Known Allergies   Neurological:  Negative for dizziness, tremors, seizures, syncope, facial asymmetry, speech difficulty, weakness, light-headedness, numbness and headaches.  Hematological: Negative.   Psychiatric/Behavioral: Negative.        Objective:   Physical Exam  Constitutional: She is oriented to person, place, and time. She appears well-developed and well-nourished. She is active. No distress.  Patient is alert and oriented and responsive to questions Engages in eye contact with provider. Speaks in full sentences without any pauses without any shortness of breath.    HENT:  Head: Normocephalic and atraumatic.  Eyes: Pupils are equal, round, and reactive to light. Conjunctivae and EOM are normal.  Neck: Normal range of motion. Neck supple.  Cardiovascular: Normal rate, regular rhythm, normal heart sounds and intact distal pulses. Exam reveals no gallop and no friction rub.  No murmur heard. Pulmonary/Chest: Breath sounds normal. No respiratory distress. She has no wheezes. She has no rales. She exhibits no tenderness.  Abdominal: Soft.  Musculoskeletal:       Right foot: There is tenderness and swelling (mild right lateral foot proximal side ). There is normal range of motion, no bony tenderness, normal capillary refill (2 + pedal pulse bilaterally. No bruising ), no crepitus, no deformity and no laceration.       Feet:  Right lateral proximal tenderness with  deep palpation, no bruising noted.   Mild tenderness heel with deep palpation.    Patient moves on  and off of exam table and in room without difficulty. Gait is normal in hall and in room. Patient is oriented to person place time and situation. Patient answers questions appropriately and engages in conversation.   Neurological: She is alert and oriented to person, place, and time. She has normal reflexes.  Skin: Skin is warm and dry. No rash noted. She is not diaphoretic. No erythema. No pallor.  Psychiatric: She has a normal mood and affect. Her  behavior is normal. Judgment and thought content normal.  Vitals reviewed.      Assessment:     Right foot pain - Plan: DG Foot Complete Right      Plan:     Meds ordered this encounter  Medications  . predniSONE (STERAPRED UNI-PAK 21 TAB) 10 MG (21) TBPK tablet    Sig: By mouth Take 6 tablets on day 1, Take 5 tablets day 2 Take 4 tablets day 3 Take 3 tablets day 4 Take 2 tablets day five 5 Take 1 tablet day    Dispense:  21 tablet    Refill:  0   Do not take NSAIDS with Prednisone.   Orders Placed This Encounter  Procedures  . DG Foot Complete Right    Standing Status:   Future    Standing Expiration Date:   10/28/2018    Order Specific Question:   Reason for Exam (SYMPTOM  OR DIAGNOSIS REQUIRED)    Answer:   pain for 2 1/2 months not improving with RICE/ tender on right lateral aspect of foot.    Order Specific Question:   Is patient pregnant?    Answer:   No    Order Specific Question:   Preferred imaging location?    Answer:   [facility]    Order Specific Question:   Call Results- Best Contact Number?    Answer:   [phone]    Order Specific Question:   Radiology Contrast Protocol - do NOT remove file path    Answer:   [email or web address]    X- ray right foot today will call with results- call office if no call within 4 days.   Also discussed [facility] Walk in from 1pm to 7pm if symptoms persist or worsen. This clinic is open Monday to Friday.She is advised to go if symptoms not improved with current treatment.  Discussed NSAID per package instruction after completing Prednisone dose pack. Denies any kidney dysfunction or history. Take NSAID no longer than 7 days if still in pain should follow up or if any symptoms worsen at any time.   Advised patient call the office or your primary care doctor for an appointment if no improvement within 72 hours or if any symptoms change or worsen at any time  Advised ER or  urgent Care if after hours or on weekend. Call 911 for emergency symptoms at any time.Patinet verbalized understanding of all instructions given/reviewed and treatment plan and has no further questions or concerns at this time.    Patient verbalized understanding of all instructions given and denies any further questions at this time.

## 2017-08-27 NOTE — Patient Instructions (Signed)
Foot Pain Many things can cause foot pain. Some common causes are:  An injury.  A sprain.  Arthritis.  Blisters.  Bunions.  Follow these instructions at home: Pay attention to any changes in your symptoms. Take these actions to help with your discomfort:  If directed, put ice on the affected area: ? Put ice in a plastic bag. ? Place a towel between your skin and the bag. ? Leave the ice on for 15-20 minutes, 3?4 times a day for 2 days.  Take over-the-counter and prescription medicines only as told by your health care provider.  Wear comfortable, supportive shoes that fit you well. Do not wear high heels.  Do not stand or walk for long periods of time.  Do not lift a lot of weight. This can put added pressure on your feet.  Do stretches to relieve foot pain and stiffness as told by your health care provider.  Rub your foot gently.  Keep your feet clean and dry.  Contact a health care provider if:  Your pain does not get better after a few days of self-care.  Your pain gets worse.  You cannot stand on your foot. Get help right away if:  Your foot is numb or tingling.  Your foot or toes are swollen.  Your foot or toes turn white or blue.  You have warmth and redness along your foot. This information is not intended to replace advice given to you by your health care provider. Make sure you discuss any questions you have with your health care provider. Document Released: 05/31/2015 Document Revised: 10/10/2015 Document Reviewed: 05/30/2014 Elsevier Interactive Patient Education  2018 [employer]  

## 2017-09-01 ENCOUNTER — Telehealth: Payer: Self-pay | Admitting: Adult Health

## 2017-09-01 NOTE — Telephone Encounter (Signed)
Left message 09/01/17 1:58pm - generic on patients home number listed to return call to office.  Provider calling to check on patient from last office visit and see how she has responded with Prednisone dose pack. If symptoms persist will be happy to refer to podiatry or she can go to [facility] as discussed at last office visit.   Right foot x ray showing IMPRESSION: Small plantar calcaneal spur. Otherwise negative.     DG Foot Complete Right [[id number]][[id number]] Resulted: 08/27/17 1608  Order Status: Completed Updated: 08/27/17 1610  Narrative:   CLINICAL DATA: Plantar right foot pain extending into the lateral aspect of the foot for 2 weeks. No known injury.  EXAM: RIGHT FOOT COMPLETE - 3+ VIEW  COMPARISON: None.  FINDINGS: No acute bony or joint abnormality is seen. Joint spaces are preserved. No focal bony lesion. Small plantar calcaneal spur noted. Soft tissues are unremarkable.  IMPRESSION: Small plantar calcaneal spur. Otherwise negative.   Electronically Signed By: Drusilla Kannerhomas Dalessio M.D. On: 08/27/2017 16:08

## 2017-09-10 ENCOUNTER — Telehealth: Payer: Self-pay

## 2019-08-14 IMAGING — CR DG FOOT COMPLETE 3+V*R*
1 series · 3 of 3 positions shown · non-contrast
Comparison: None.

CLINICAL DATA: Plantar right foot pain extending into the lateral
aspect of the foot for 2 weeks. No known injury.

EXAM:
RIGHT FOOT COMPLETE - 3+ VIEW

[Series 1: dg foot complete right · 0.14mm/px · 3 of 3 slices shown]
[im 1/3]
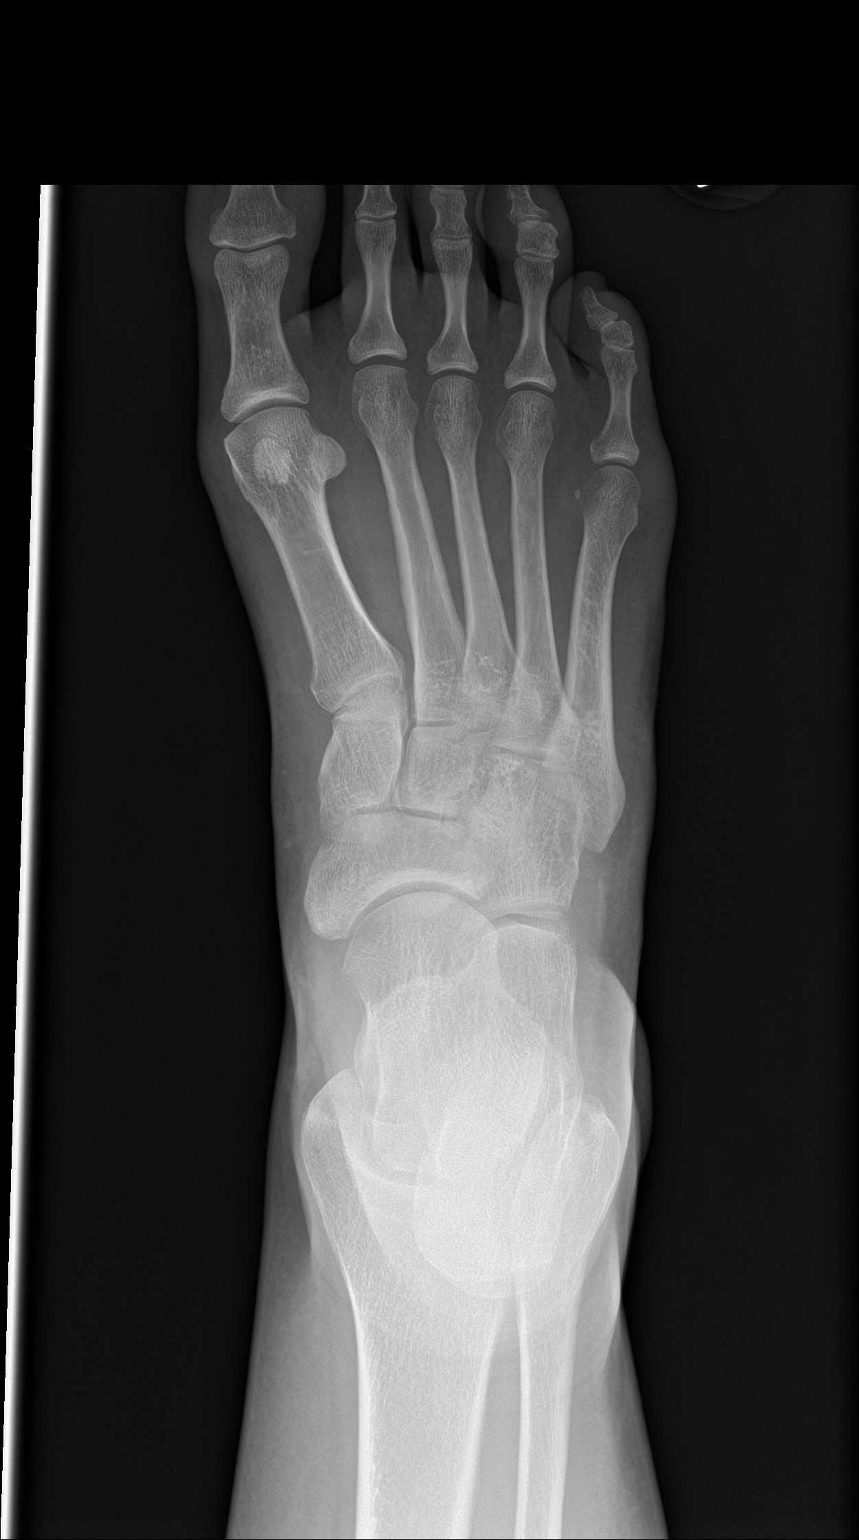
[im 2/3]
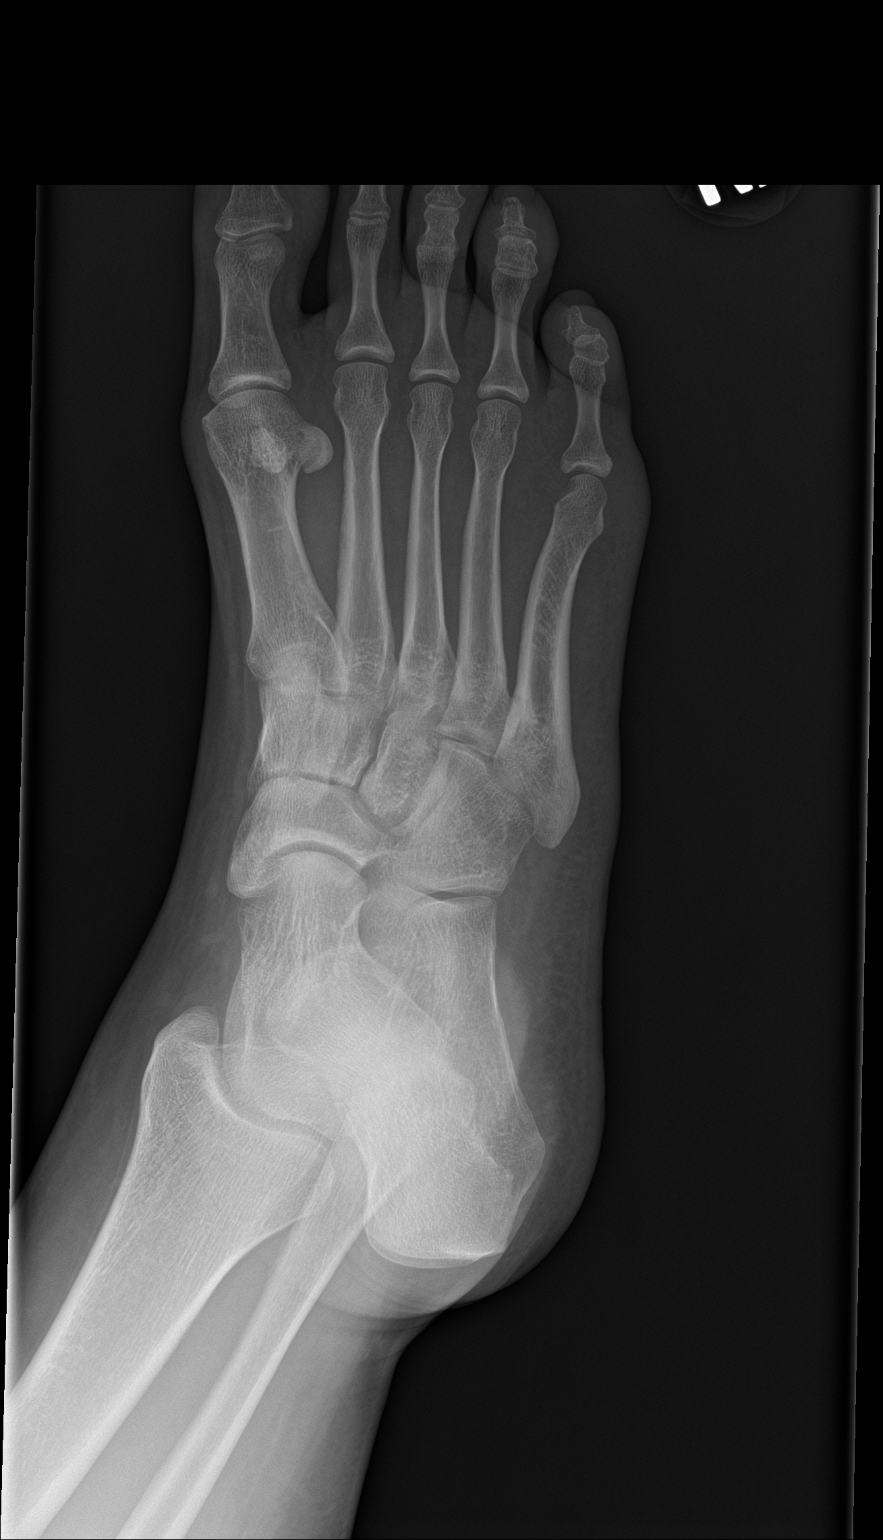
[im 3/3]
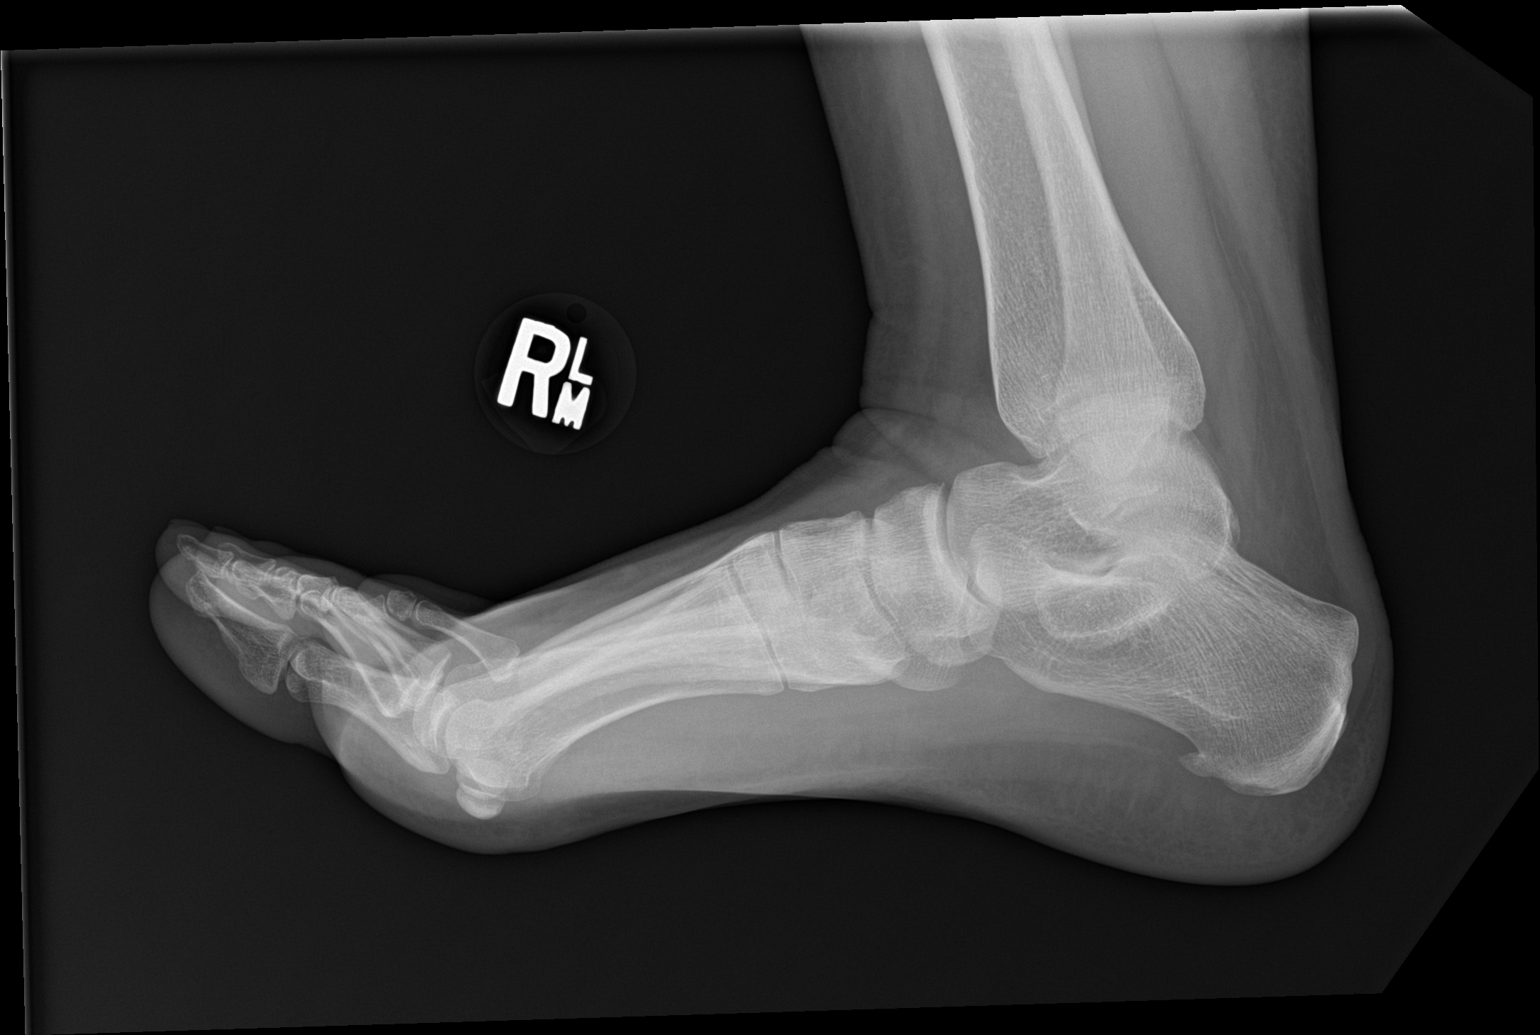

[3 of 3 positions shown; findings below may reference images not displayed]

FINDINGS: No acute bony or joint abnormality is seen. Joint spaces are
preserved. No focal bony lesion. Small plantar calcaneal spur noted.
Soft tissues are unremarkable.
IMPRESSION: Small plantar calcaneal spur.  Otherwise negative.
